# Patient Record
Sex: Female | Born: 1985 | Race: White | Hispanic: No | Marital: Single | Smoking: Former smoker
Health system: Southern US, Community
[De-identification: ages and names within clinical notes are randomized; demographics above are authoritative.]

## PROBLEM LIST (undated history)

## (undated) DIAGNOSIS — T4145XA Adverse effect of unspecified anesthetic, initial encounter: Secondary | ICD-10-CM

## (undated) DIAGNOSIS — I10 Essential (primary) hypertension: Secondary | ICD-10-CM

## (undated) DIAGNOSIS — G43909 Migraine, unspecified, not intractable, without status migrainosus: Secondary | ICD-10-CM

## (undated) DIAGNOSIS — T8859XA Other complications of anesthesia, initial encounter: Secondary | ICD-10-CM

## (undated) DIAGNOSIS — F410 Panic disorder [episodic paroxysmal anxiety] without agoraphobia: Secondary | ICD-10-CM

## (undated) DIAGNOSIS — R002 Palpitations: Secondary | ICD-10-CM

## (undated) HISTORY — DX: Panic disorder (episodic paroxysmal anxiety): F41.0

## (undated) HISTORY — DX: Palpitations: R00.2

## (undated) HISTORY — PX: HAND SURGERY: SHX662

## (undated) HISTORY — PX: WISDOM TOOTH EXTRACTION: SHX21

## (undated) HISTORY — DX: Migraine, unspecified, not intractable, without status migrainosus: G43.909

## (undated) HISTORY — PX: MOUTH SURGERY: SHX715

---
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
## 2006-08-10 ENCOUNTER — Encounter (HOSPITAL_COMMUNITY): Admission: RE | Admit: 2006-08-10 | Discharge: 2006-09-09 | Payer: Self-pay | Admitting: Orthopaedic Surgery

## 2007-01-10 ENCOUNTER — Ambulatory Visit (HOSPITAL_COMMUNITY): Admission: RE | Admit: 2007-01-10 | Discharge: 2007-01-10 | Payer: Self-pay | Admitting: Family Medicine

## 2007-01-13 ENCOUNTER — Ambulatory Visit: Payer: Self-pay | Admitting: Orthopedic Surgery

## 2007-01-21 ENCOUNTER — Ambulatory Visit (HOSPITAL_COMMUNITY): Admission: RE | Admit: 2007-01-21 | Discharge: 2007-01-21 | Payer: Self-pay | Admitting: Orthopedic Surgery

## 2007-01-21 ENCOUNTER — Ambulatory Visit: Payer: Self-pay | Admitting: Orthopedic Surgery

## 2007-01-25 ENCOUNTER — Ambulatory Visit: Payer: Self-pay | Admitting: Orthopedic Surgery

## 2007-02-07 ENCOUNTER — Ambulatory Visit: Payer: Self-pay | Admitting: Orthopedic Surgery

## 2007-02-17 ENCOUNTER — Ambulatory Visit: Payer: Self-pay | Admitting: Orthopedic Surgery

## 2007-03-15 ENCOUNTER — Ambulatory Visit: Payer: Self-pay | Admitting: Orthopedic Surgery

## 2008-01-02 ENCOUNTER — Other Ambulatory Visit: Admission: RE | Admit: 2008-01-02 | Discharge: 2008-01-02 | Payer: Self-pay | Admitting: Obstetrics and Gynecology

## 2008-02-10 ENCOUNTER — Encounter: Payer: Self-pay | Admitting: Orthopedic Surgery

## 2008-03-10 ENCOUNTER — Emergency Department (HOSPITAL_COMMUNITY): Admission: EM | Admit: 2008-03-10 | Discharge: 2008-03-10 | Payer: Self-pay | Admitting: Emergency Medicine

## 2008-03-10 ENCOUNTER — Encounter: Payer: Self-pay | Admitting: Orthopedic Surgery

## 2008-03-14 ENCOUNTER — Ambulatory Visit: Payer: Self-pay | Admitting: Orthopedic Surgery

## 2008-03-14 DIAGNOSIS — S52539A Colles' fracture of unspecified radius, initial encounter for closed fracture: Secondary | ICD-10-CM | POA: Insufficient documentation

## 2008-03-14 DIAGNOSIS — M25519 Pain in unspecified shoulder: Secondary | ICD-10-CM | POA: Insufficient documentation

## 2008-04-23 ENCOUNTER — Ambulatory Visit: Payer: Self-pay | Admitting: Orthopedic Surgery

## 2008-07-09 ENCOUNTER — Ambulatory Visit (HOSPITAL_COMMUNITY): Admission: RE | Admit: 2008-07-09 | Discharge: 2008-07-09 | Payer: Self-pay | Admitting: Family Medicine

## 2008-07-30 ENCOUNTER — Emergency Department (HOSPITAL_COMMUNITY): Admission: EM | Admit: 2008-07-30 | Discharge: 2008-07-31 | Payer: Self-pay | Admitting: Emergency Medicine

## 2009-11-21 IMAGING — CR DG WRIST COMPLETE 3+V*L*
2 series · 2 of 2 positions shown · non-contrast
Comparison: None

CLINICAL DATA: Fall, left wrist pain

LEFT WRIST - COMPLETE 3+ VIEW

[view not recorded (1 of 2)]
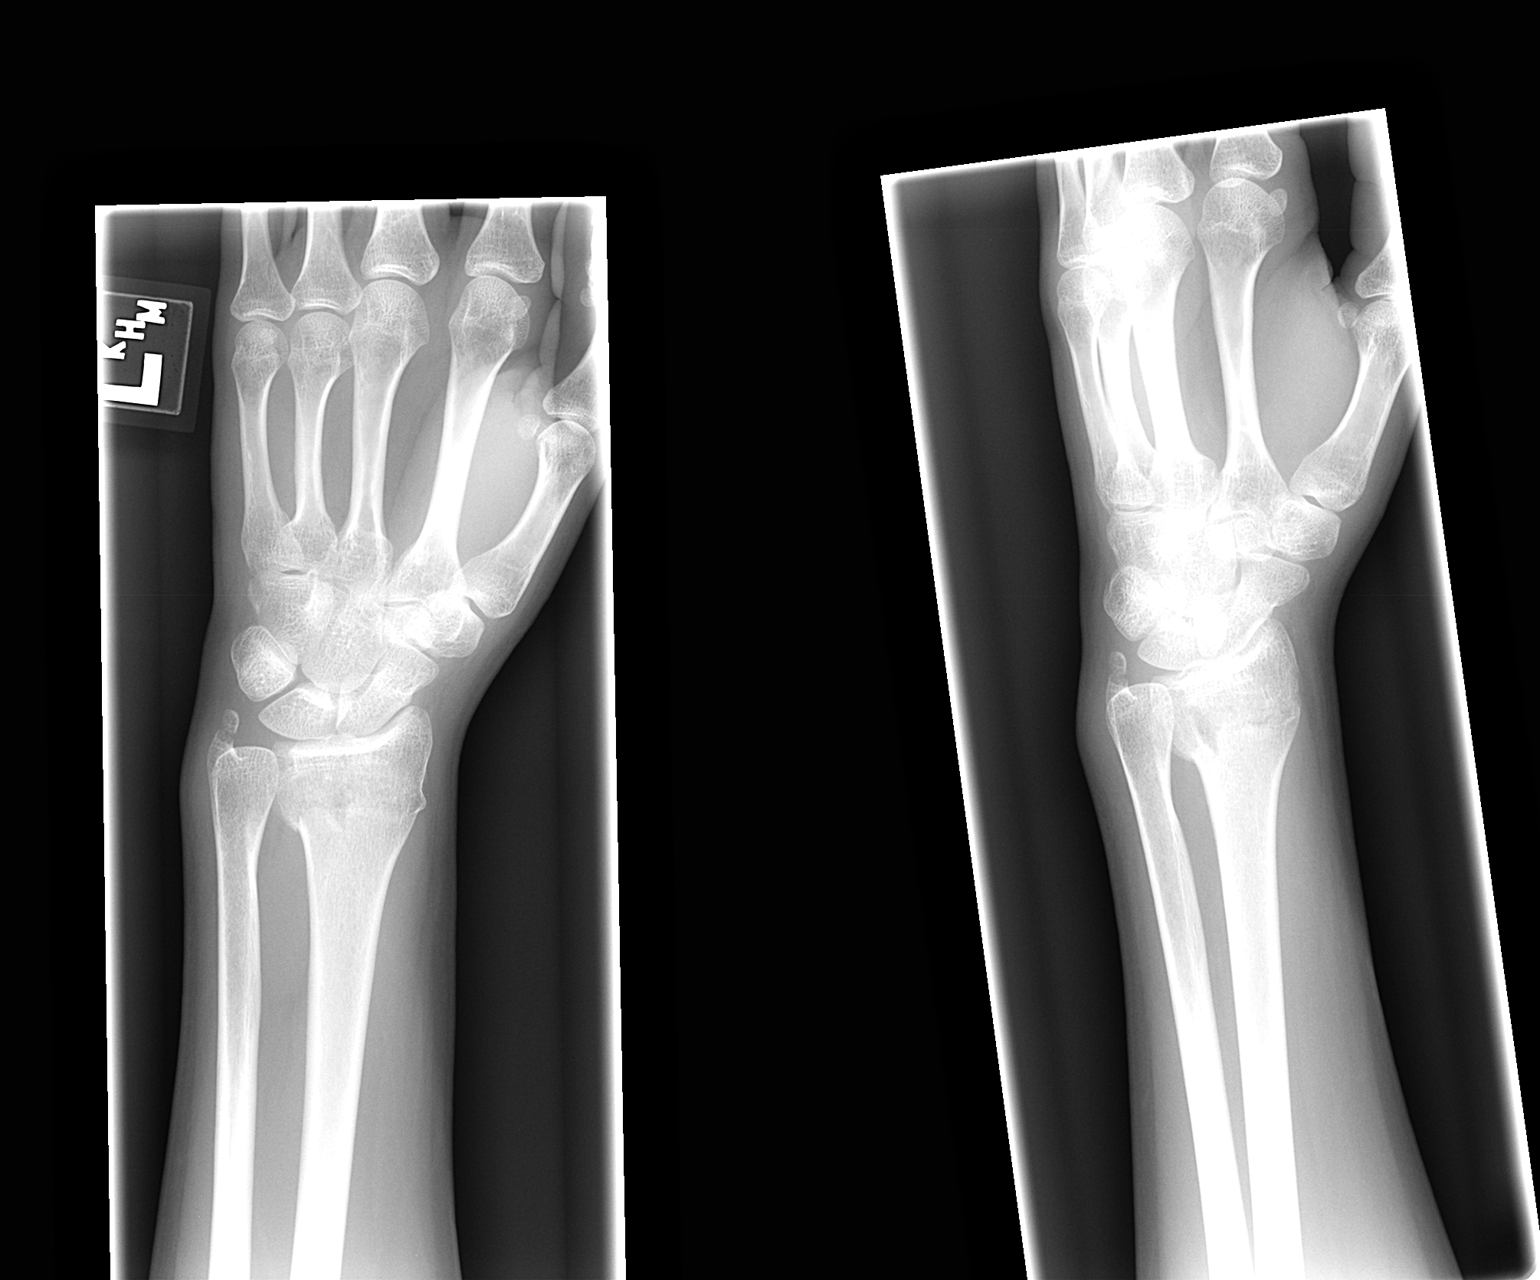

[view not recorded (2 of 2)]
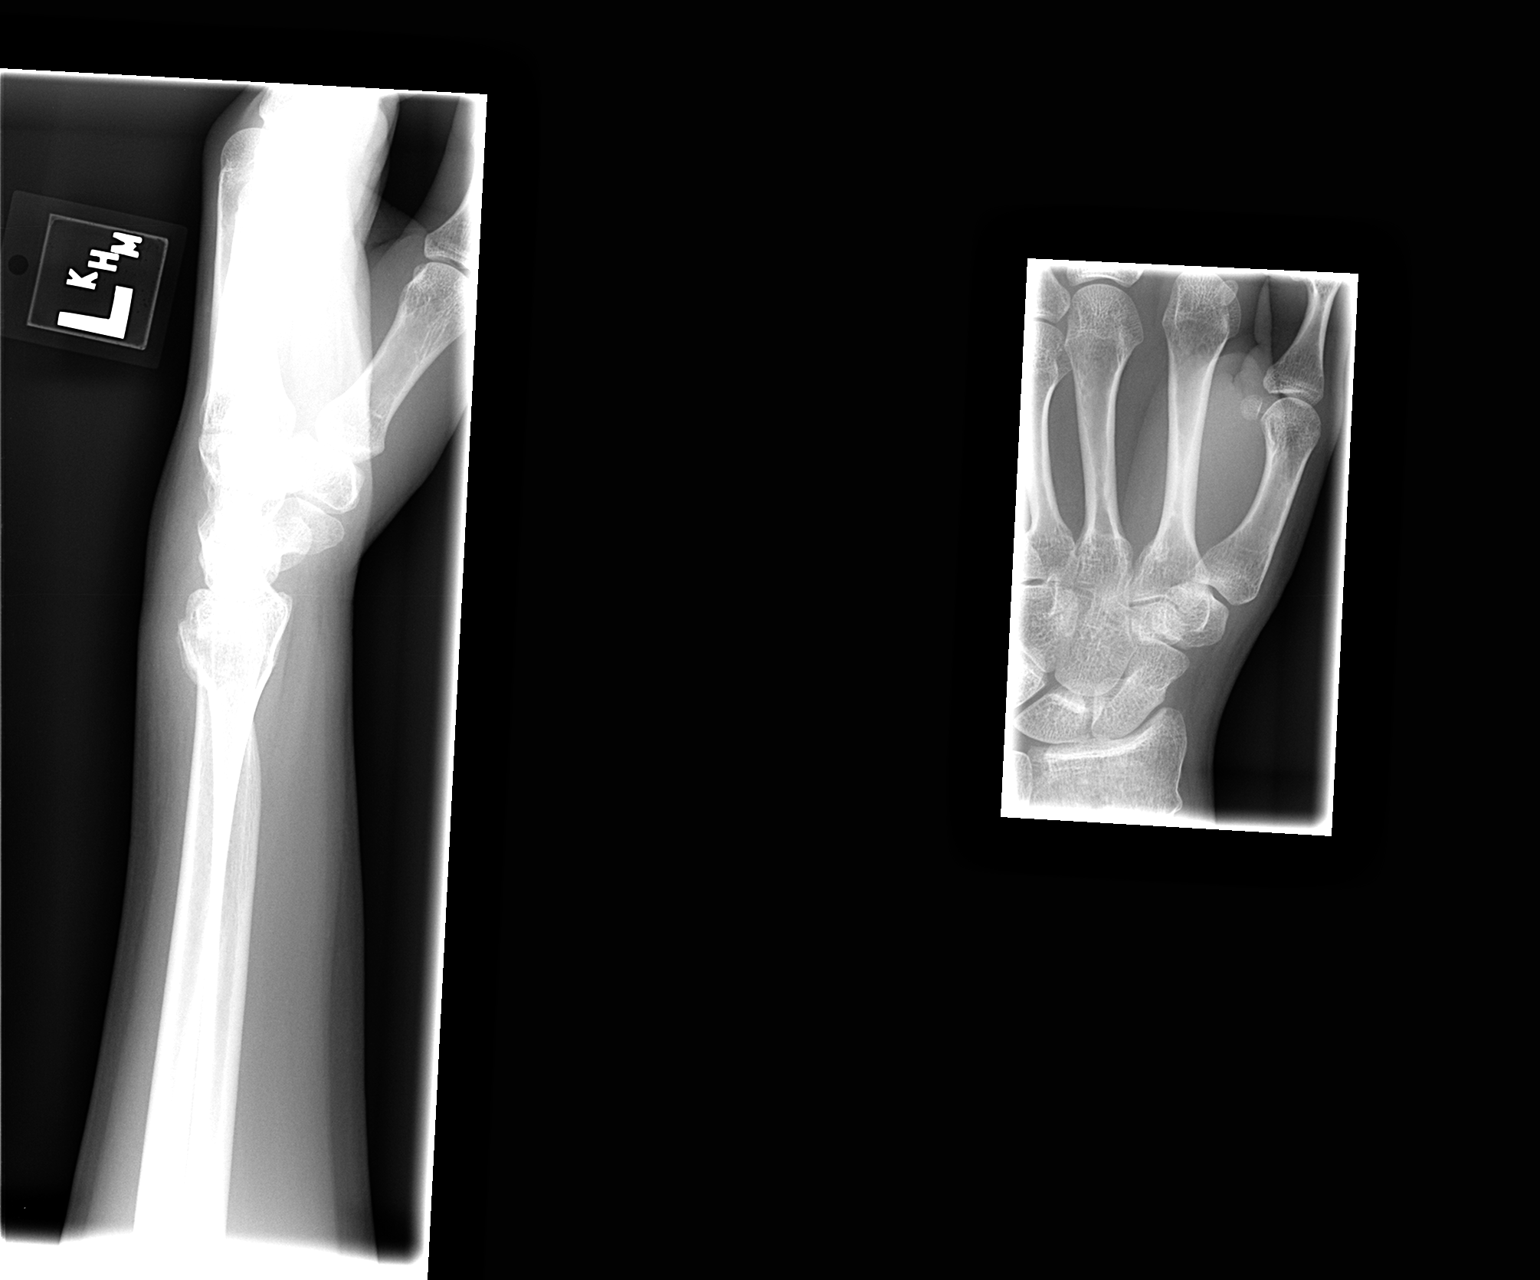

[2 of 2 positions shown; findings below may reference images not displayed]

FINDINGS: Transverse fractures through the distal left radius and
ulnar styloid process are noted, without appreciable fracture
fragment angulation.  Overlying soft tissue swelling is present.
No radiopaque foreign body.
IMPRESSION: Distal left radius and ulnar styloid process fractures.

## 2010-03-22 IMAGING — CT CT HEAD W/O CM
1 series · 16 of 30 positions shown, 20 images · non-contrast
Comparison: None available

CLINICAL DATA: Headaches, dizziness

CT HEAD WITHOUT CONTRAST
TECHNIQUE: Contiguous axial images were obtained from the base of
the skull through the vertex without contrast.

[Series 2: headseq 4.8 h37s · axial · 0.43mm/px · z∈[+96,+256]mm · 16 of 36 slices shown, 20 images]
[im 2/36  brain]
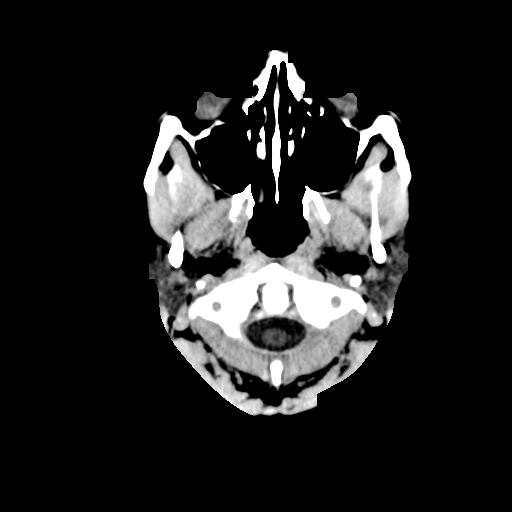
[im 2/36  bone]
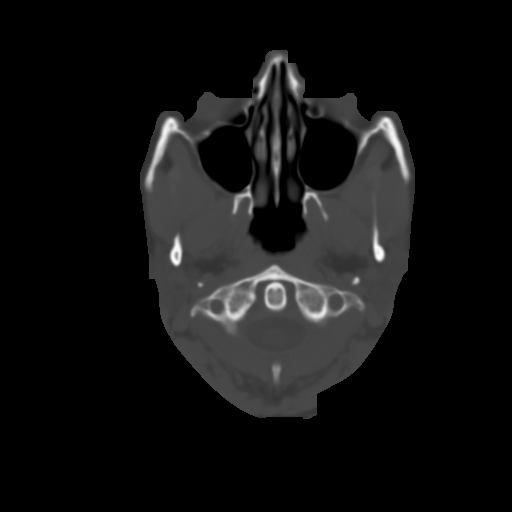
[im 4/36  brain]
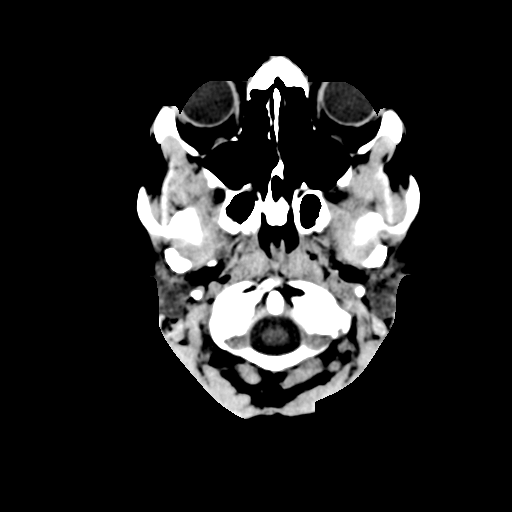
[im 7/36  brain]
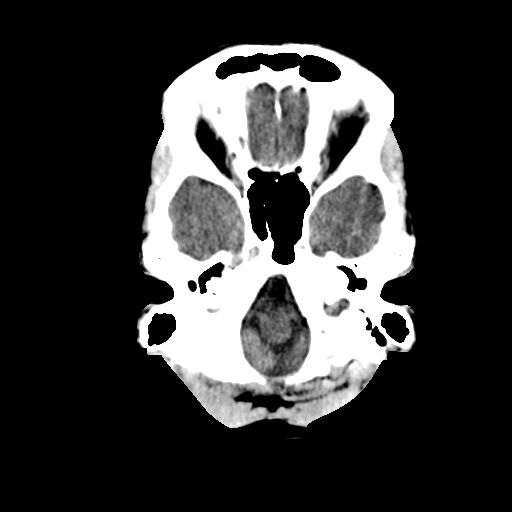
[im 9/36  brain]
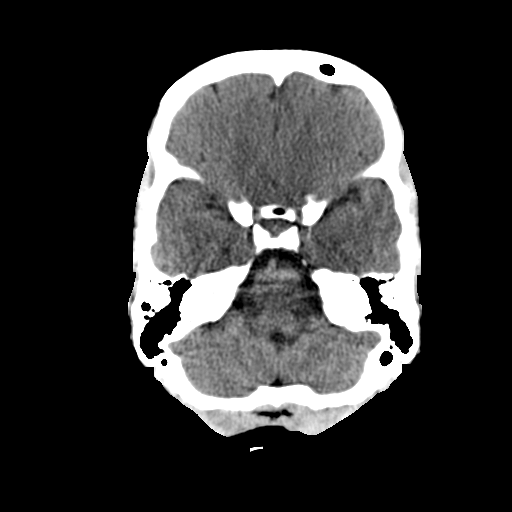
[im 10/36  brain]
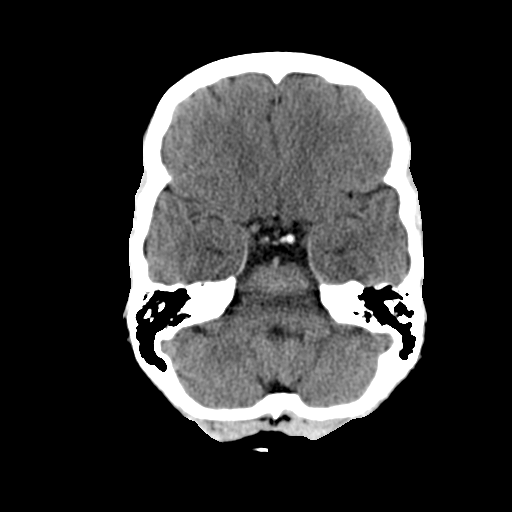
[im 10/36  bone]
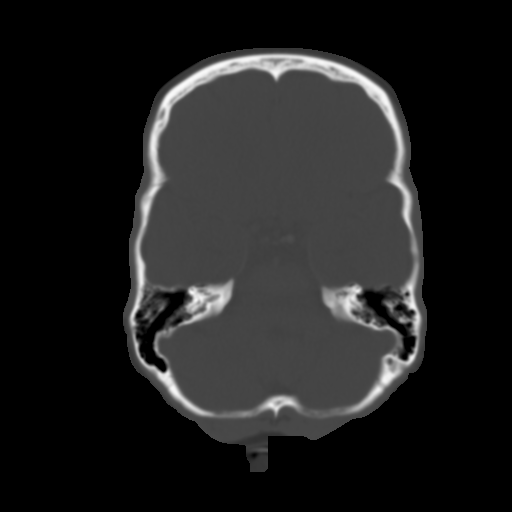
[im 13/36  brain]
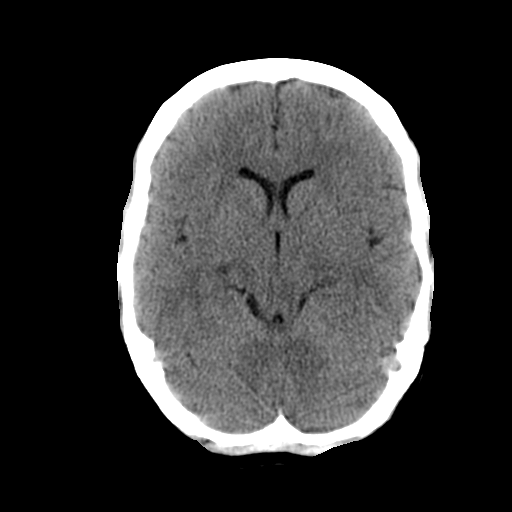
[im 15/36  brain]
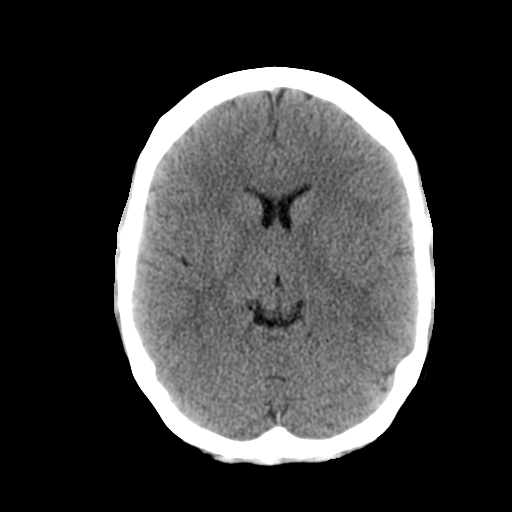
[im 17/36  brain]
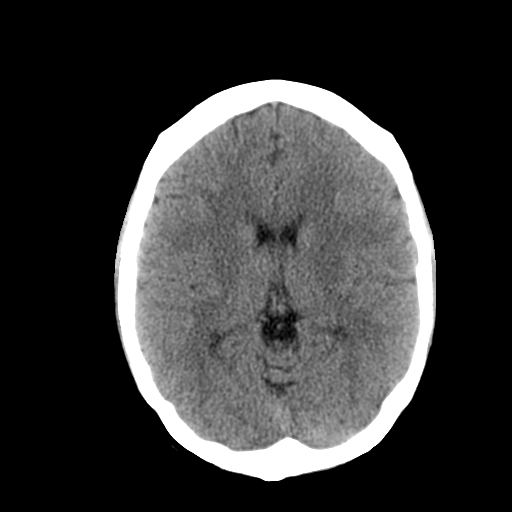
[im 19/36  brain]
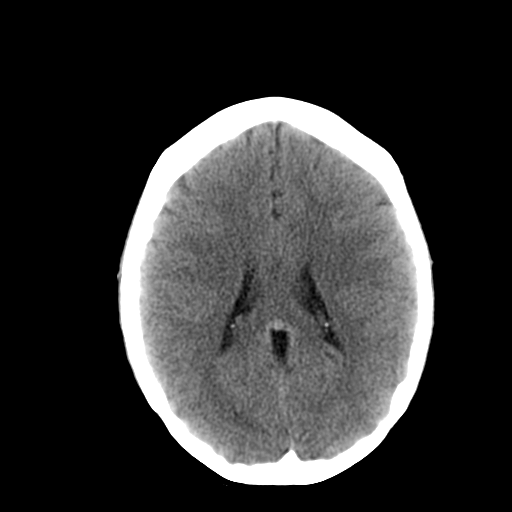
[im 19/36  bone]
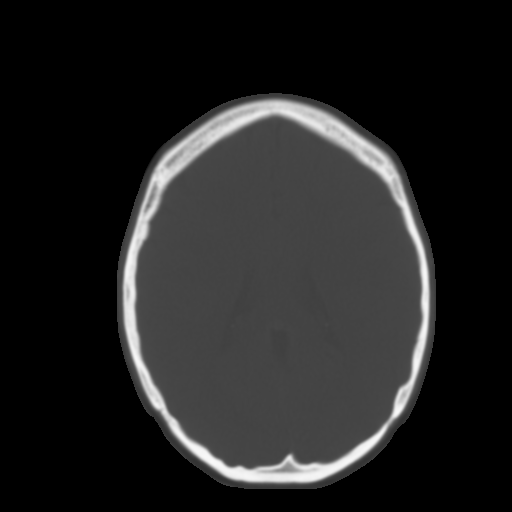
[im 21/36  brain]
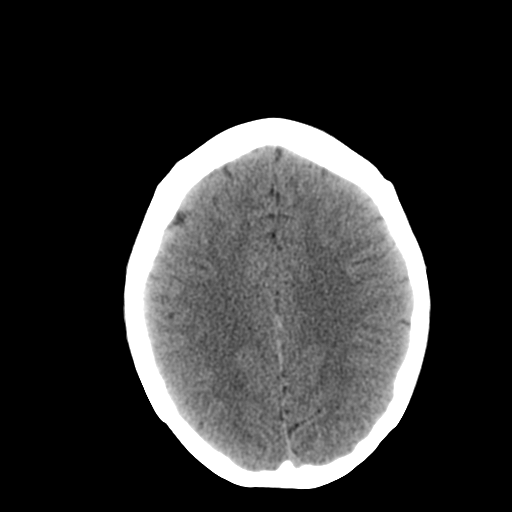
[im 23/36  brain]
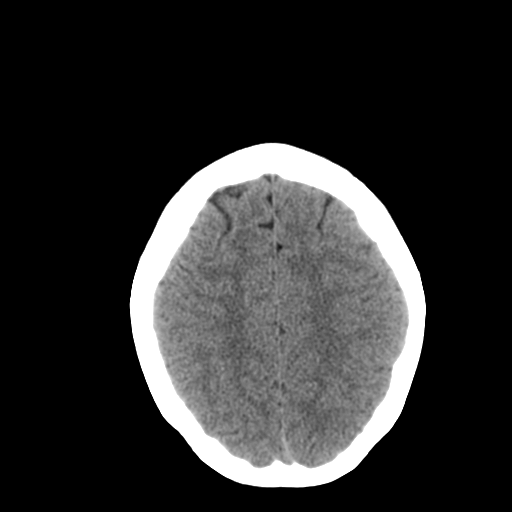
[im 26/36  brain]
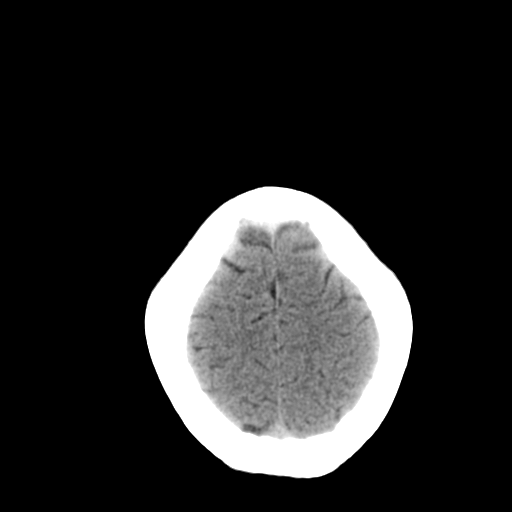
[im 27/36  brain]
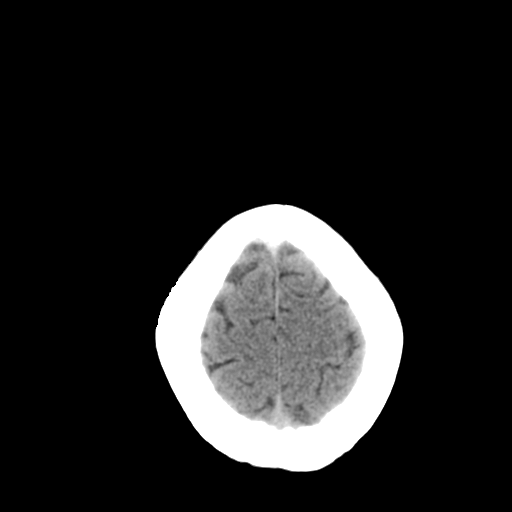
[im 27/36  bone]
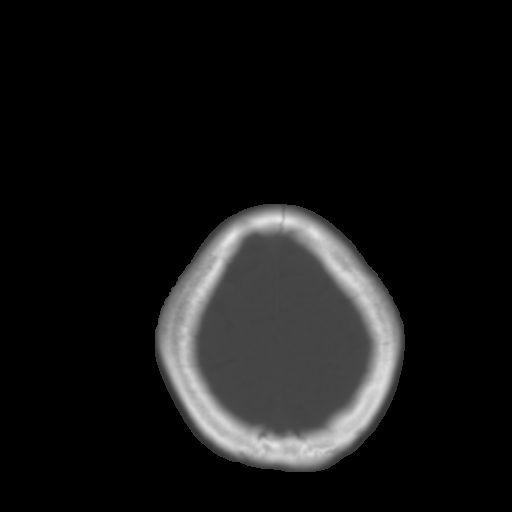
[im 29/36  brain]
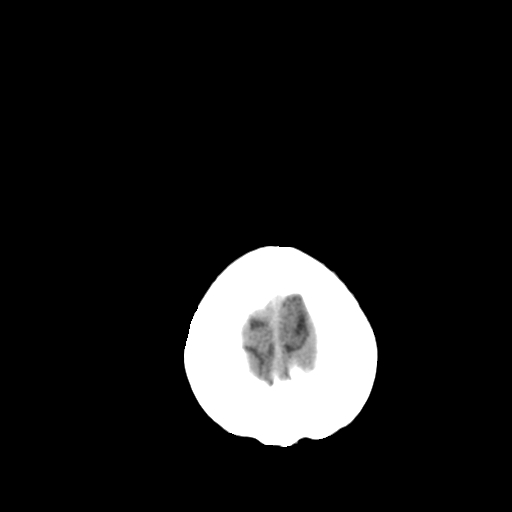
[im 32/36  brain]
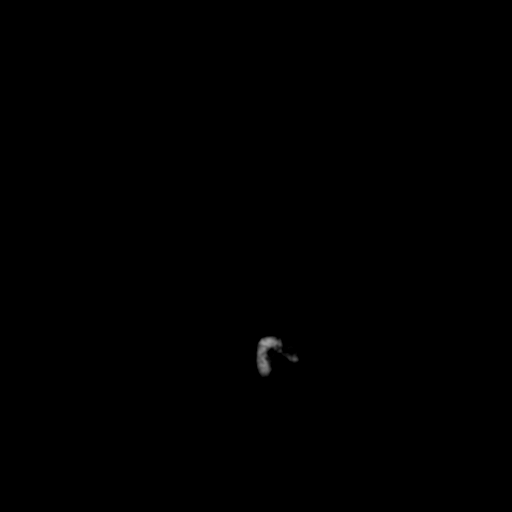
[im 34/36  brain]
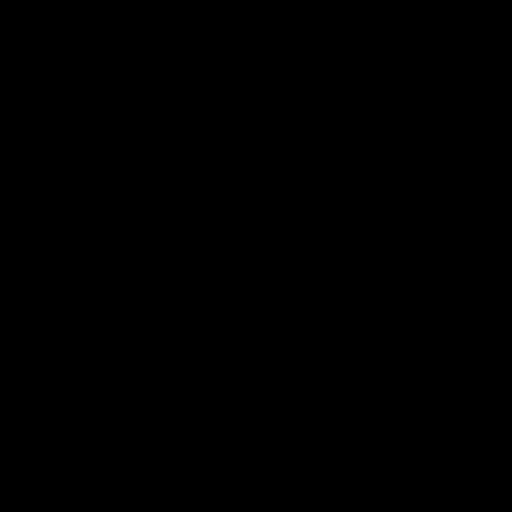

[16 of 30 positions shown; findings below may reference images not displayed]

FINDINGS: There is no evidence of acute intracranial hemorrhage,
brain edema, mass,  mass effect, or midline shift. Acute infarct
may be inapparent on noncontrast CT.  No other intra-axial
abnormalities are seen, and the ventricles and sulci are within
normal limits in size and symmetry.   No abnormal extra-axial fluid
collections or masses are identified.  No significant calvarial
abnormality.
IMPRESSION: 1.  Negative for bleed or other acute intracranial process.

## 2010-04-29 ENCOUNTER — Emergency Department (HOSPITAL_COMMUNITY): Admission: EM | Admit: 2010-04-29 | Discharge: 2010-04-29 | Payer: Self-pay | Admitting: Emergency Medicine

## 2010-05-08 ENCOUNTER — Emergency Department (HOSPITAL_COMMUNITY): Admission: EM | Admit: 2010-05-08 | Discharge: 2010-05-08 | Payer: Self-pay | Admitting: Family Medicine

## 2011-01-25 LAB — URINALYSIS, ROUTINE W REFLEX MICROSCOPIC
Glucose, UA: NEGATIVE mg/dL
Hgb urine dipstick: NEGATIVE
Ketones, ur: >80 mg/dL — AB
Nitrite: NEGATIVE
Protein, ur: NEGATIVE mg/dL
Specific Gravity, Urine: >1.03 — ABNORMAL HIGH (ref 1.005–1.030)
Urobilinogen, UA: 0.2 mg/dL (ref 0.0–1.0)
pH: 5 (ref 5.0–8.0)

## 2011-01-25 LAB — POCT PREGNANCY, URINE: Preg Test, Ur: NEGATIVE

## 2011-01-25 LAB — PREGNANCY, URINE: Preg Test, Ur: NEGATIVE

## 2011-03-27 NOTE — H&P (Signed)
Carrie Giles, Carrie Giles                 ACCOUNT NO.:  [id number]   MEDICAL RECORD NO.:  [id number]          PATIENT TYPE:  AMB   LOCATION:                                FACILITY:  [facility]   PHYSICIAN:  Vickki Hearing, M.D.DATE OF BIRTH:  [birth year]   DATE OF ADMISSION:  DATE OF DISCHARGE:  LH                              HISTORY & PHYSICAL   CHIEF COMPLAINT:  Pain over the right fifth metacarpal.   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  Sustained a fifth  metacarpal fracture.  Presented to her primary care doctor's at [facility] where x-rays were taken and showed a fifth metacarpal  fracture with greater than 25 degrees of angulation and mild rotation.  She was sent to our office for definitive care.   She complains of intermittent right hand pain graded at 5/10,  occasionally 8/10, which is aching, associated with swelling, relieved  with splinting and rest.   REVIEW OF SYSTEMS:  She denies weight loss, chest pain, shortness of  breath, nausea, hematuria, thyroid disease, diabetes, skin cancer,  vertigo, or tinnitus.  She does report headache, joint pain, depression,  mood swings, panic attacks, and seasonal allergies.   She does not have any allergies to medication.   No medical problems.  She had surgery for impacted teeth.   She is currently on Advil and Implanon.   She has a negative family history.   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  Does not smoke and drinks very  little.  She is single.   VITAL SIGNS:  To be recorded in preop.  Weight 110.  APPEARANCE:  Normal, thin, normal development.  HEENT:  Normal.  NECK:  Supple.  RESPIRATORY:  Clear.  CARDIAC:  Regular rate and rhythm.  PERIPHERAL VASCULAR SYSTEM:  No swelling.  Normal pulses and  temperature.  LYMPH NODES:  Benign.  ABDOMEN:  Nondistended.  EXTREMITIES:  Examination reveals tenderness and swelling  over the fifth  metacarpal neck.  There is mild rotation on the passive flexion test  compared to the opposite hand.  There is tenderness over the distal  fifth metacarpal bone, and grip strength has been affected.  Skin has  evidence of subcu bleeding.  Sensation is normal.  NEUROLOGIC:  She is oriented x3.  Mood and affect are normal.   X-rays do show an angulated fifth metacarpal fracture.  Clinically,  there is rotational malalignment.  Recommend closed reduction and  percutaneous pinning of 5 to 4.   DIAGNOSIS:  Closed right fifth metacarpal fracture.  Code 815.03.  CPT  code 78295.  I did give her 60 Lortab 5 mg; take 1 q.4 p.r.n. for pain      Vickki Hearing, M.D.  Electronically Signed     SEH/MEDQ  D:  01/13/2007  T:  01/13/2007  Job:  [id number]   cc:   [facility]  Fax: [phone]   Vickki Hearing, M.D.  Fax: [phone]

## 2011-03-27 NOTE — Op Note (Signed)
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  The  recommendation was made for surgery. Because of other commitments, she  could not do the surgery until today.  Informed consent was obtained.   PREOPERATIVE DIAGNOSIS:  Closed right fifth metacarpal neck fracture.   POSTOPERATIVE DIAGNOSIS:  Closed right fifth metacarpal neck fracture.   PROCEDURE:  Closed reduction, percutaneous pinning, right fifth  metacarpal fracture.   FINDINGS:  Rotationally deformed angulated right fifth metacarpal  fracture.   SURGEON:  Vickki Hearing, M.D.   ASSISTANT:  None.   ANESTHESIA:  Regional Bier block.   SPECIMENS:  None.   ESTIMATED BLOOD LOSS:  None.   COMPLICATIONS:  None.   COUNTS:  Correct.   DISPOSITION:  The patient went to PACU in good condition.   POSTOPERATIVE PLAN:  Ice, elevation, and follow-up in the office.  She  is discharged home with Lortab 5 mg, 1-2 q.4h. for pain.   DETAILS OF THE PROCEDURE:  This patient was first seen in the preop  holding area identified as Clement Sayres.  The proper marking was placed  over the right fifth metacarpal, countersigned by the surgeon, and the  history and physical was updated.  She was taken to the operating room  where a Bier block was established, antibiotics were started, the right  upper extremity was prepped and draped using sterile technique.  The the  time out procedure was completed, limb site confirmed as right fifth  metacarpal.   A closed manipulation was performed under C-arm.  Two pins were placed  from the fifth metacarpal to the fourth metacarpal. Radiographs, AP,  lateral, and oblique, confirmed reduction.  The finger was wrapped up  and splinted 4-5 with the ring finger serving as a splint.  The  tourniquet was released.  Fingers were viable.  The patient was taken to  the recovery room in good condition.  We did give her a supplemental 5  mL block of Marcaine to assist with postoperative pain.      Vickki Hearing, M.D.  Electronically Signed     SEH/MEDQ  D:  01/21/2007  T:  01/22/2007  Job:  [id number]

## 2011-12-22 ENCOUNTER — Other Ambulatory Visit (HOSPITAL_COMMUNITY)
Admission: RE | Admit: 2011-12-22 | Discharge: 2011-12-22 | Disposition: A | Discharge status: Home / Self Care | Payer: Self-pay | Source: Ambulatory Visit | Attending: Obstetrics and Gynecology | Admitting: Obstetrics and Gynecology

## 2011-12-22 DIAGNOSIS — Z01419 Encounter for gynecological examination (general) (routine) without abnormal findings: Secondary | ICD-10-CM | POA: Insufficient documentation

## 2011-12-22 DIAGNOSIS — Z113 Encounter for screening for infections with a predominantly sexual mode of transmission: Secondary | ICD-10-CM | POA: Insufficient documentation

## 2013-05-24 ENCOUNTER — Telehealth: Payer: Self-pay | Admitting: *Deleted

## 2013-05-24 NOTE — Telephone Encounter (Signed)
Spoke with pt. Had sex yesterday and was fine afterwards but about 5-6 hours later she started bleeding and cramping. Supposed to have period in 2 weeks. No new partner. Has no allergies to Ibuprofen so I advised to take Ibuprofen for discomfort. Not pregnant, had a normal period last month. Has an appt scheduled for tomorrow. Pt reassured. JSY

## 2013-05-25 ENCOUNTER — Ambulatory Visit: Payer: Self-pay | Admitting: Obstetrics and Gynecology

## 2013-11-09 DIAGNOSIS — R002 Palpitations: Secondary | ICD-10-CM

## 2013-11-09 HISTORY — DX: Palpitations: R00.2

## 2013-12-15 ENCOUNTER — Ambulatory Visit: Payer: Self-pay | Admitting: Cardiology

## 2013-12-26 ENCOUNTER — Ambulatory Visit: Payer: Self-pay | Admitting: Cardiovascular Disease

## 2014-01-17 ENCOUNTER — Encounter: Payer: Self-pay | Admitting: *Deleted

## 2014-01-17 DIAGNOSIS — F419 Anxiety disorder, unspecified: Secondary | ICD-10-CM | POA: Insufficient documentation

## 2014-01-18 ENCOUNTER — Encounter: Payer: Self-pay | Admitting: Internal Medicine

## 2014-01-18 ENCOUNTER — Ambulatory Visit (INDEPENDENT_AMBULATORY_CARE_PROVIDER_SITE_OTHER): Payer: BC Managed Care – PPO | Admitting: Internal Medicine

## 2014-01-18 ENCOUNTER — Encounter (INDEPENDENT_AMBULATORY_CARE_PROVIDER_SITE_OTHER): Payer: Self-pay

## 2014-01-18 VITALS — BP 119/74 | HR 79 | Ht 66.0 in | Wt 120.0 lb

## 2014-01-18 DIAGNOSIS — R002 Palpitations: Secondary | ICD-10-CM

## 2014-01-18 NOTE — Progress Notes (Signed)
HPI Patient is a 28 yo who is referred for palpitations.   The patient says the spells began about 2 to 4 years ago when she started having panic attacks.  Not sure what triggers.  Spells usually occur at night.  Can happen every night.  Occasionally dizzy  Took BP ecently with spell and was 157/105.  Had some N/V  Pt says she has had N/V with panic spells and has passed out.  She does not remember. Does not notice at other times.  Active  Paints    Notes occasional sharp chest pains ( L sided that are pleuritic)  She was seen by primary MD recently  TSH, CBC and electrolytes were normal Placed on propranolol  She thinks they have helped.    Recent  LDL 92, HDL 55.   Allergies  Allergen Reactions  . Zanaflex [Tizanidine Hcl]     Current Outpatient Prescriptions  Medication Sig Dispense Refill  . busPIRone (BUSPAR) 5 MG tablet Take 5 mg by mouth at bedtime.       . diazepam (VALIUM) 5 MG tablet Take 5 mg by mouth every 6 (six) hours as needed for anxiety.      Marland Kitchen omeprazole (PRILOSEC) 20 MG capsule Take 20 mg by mouth daily.      . propranolol (INDERAL) 20 MG tablet Take 20 mg by mouth 2 (two) times daily.        No current facility-administered medications for this visit.    History reviewed. No pertinent past medical history.  Past Surgical History  Procedure Laterality Date  . Mouth surgery    . Hand surgery      Family History  Problem Relation Age of Onset  . Heart disease Father   . Colitis Brother   . Thyroid disease Sister     History   Social History  . Marital Status: Single    Spouse Name: N/A    Number of Children: N/A  . Years of Education: N/A   Occupational History  . Not on file.   Social History Main Topics  . Smoking status: Former Games developer  . Smokeless tobacco: Not on file  . Alcohol Use: Yes  . Drug Use: No  . Sexual Activity: Not on file   Other Topics Concern  . Not on file   Social History Narrative  . No narrative on file    Review  of Systems:  All systems reviewed.  They are negative to the above problem except as previously stated.  Vital Signs: BP 119/74  Pulse 79  Ht 5\' 6"  (1.676 m)  Wt 120 lb (54.432 kg)  BMI 19.38 kg/m2  Physical Exam Patient is in NAD HEENT:  Normocephalic, atraumatic. EOMI, PERRLA.  Neck: JVP is normal.  No bruits.  Lungs: clear to auscultation. No rales no wheezes.  Heart: Regular rate and rhythm. Normal S1, S2. No S3.   No significant murmurs. PMI not displaced.  Abdomen:  Supple, nontender. Normal bowel sounds. No masses. No hepatomegaly.  Extremities:   Good distal pulses throughout. No lower extremity edema.  Musculoskeletal :moving all extremities.  Neuro:   alert and oriented x3.  CN II-XII grossly intact.  EKG  SR  70 bpm   Assessment and Plan:  1.  Palpitations.  I am not sure what these represent.  Only at night  BP at time is very high.  (if reliable)   I would set up for event monitor. Continue current meds  I also recomm  that she keep a diary of symptoms  Will follow up with patient with test results.    2.  Tob  Patient says she has quit.    3.  Lipids  Excellent

## 2014-01-18 NOTE — Patient Instructions (Addendum)
Your physician wants you to follow-up to be determined after results of 28 day event monitor   Your physician recommends that you continue on your current medications as directed. Please refer to the Current Medication list given to you today.    Your physician has recommended that you wear a 28 day event monitor. Event monitors are medical devices that record the heart's electrical activity. Doctors most often us these monitors to diagnose arrhythmias. Arrhythmias are problems with the speed or rhythm of the heartbeat. The monitor is a small, portable device. You can wear one while you do your normal daily activities. This is usually used to diagnose what is causing palpitations/syncope (passing out).      Thank you for choosing [facility] !

## 2014-01-19 ENCOUNTER — Encounter: Payer: Self-pay | Admitting: Internal Medicine

## 2014-01-23 ENCOUNTER — Encounter: Payer: Self-pay | Admitting: Internal Medicine

## 2014-02-16 ENCOUNTER — Telehealth: Payer: Self-pay | Admitting: *Deleted

## 2014-02-16 NOTE — Telephone Encounter (Signed)
Ecardio called with a serious EKG of SVT 162. Called pt she was sitting at the time and states she feels well.

## 2014-02-19 NOTE — Telephone Encounter (Signed)
Strips sent to [facility] [address] [facility] for Dr Tenny Crawoss review on 02-19-14

## 2014-02-19 NOTE — Telephone Encounter (Signed)
I need to see strips  I am not in [place] for awhile  Send to [address].

## 2014-02-22 NOTE — Telephone Encounter (Signed)
Reviewed.  Strips shows sinus tachycardia  No arrhythmia. Follow.

## 2014-02-23 ENCOUNTER — Telehealth: Payer: Self-pay | Admitting: *Deleted

## 2014-02-23 NOTE — Telephone Encounter (Signed)
[facility]enny Crawoss review.

## 2014-03-05 ENCOUNTER — Telehealth: Payer: Self-pay | Admitting: *Deleted

## 2014-03-05 NOTE — Telephone Encounter (Signed)
Left message for pt to call Monitor reviewed by dr Tenny Crawross shows sinus/sinus tach No arrhythmia. Rec: can cont inderal. Can increase to 20 tid or 40 bid. Stay hydrated. Increase salt intake.

## 2014-03-20 NOTE — Telephone Encounter (Signed)
Left message for pt to call.

## 2014-03-20 NOTE — Telephone Encounter (Signed)
Spoke with pt, Aware of dr ross recommendation  

## 2014-05-02 ENCOUNTER — Telehealth: Payer: Self-pay | Admitting: Internal Medicine

## 2014-05-02 MED ORDER — PROPRANOLOL HCL 20 MG PO TABS: 20.0000 mg | ORAL_TABLET | Freq: Two times a day (BID) | ORAL | Status: DC

## 2014-05-02 NOTE — Telephone Encounter (Signed)
Medication sent via escribe.  

## 2014-05-02 NOTE — Telephone Encounter (Signed)
Patient states that she needs Propan[facility] refill [facility]

## 2014-06-07 ENCOUNTER — Ambulatory Visit (INDEPENDENT_AMBULATORY_CARE_PROVIDER_SITE_OTHER): Payer: BC Managed Care – PPO | Admitting: Advanced Practice Midwife

## 2014-06-07 ENCOUNTER — Encounter: Payer: Self-pay | Admitting: Advanced Practice Midwife

## 2014-06-07 ENCOUNTER — Other Ambulatory Visit (INDEPENDENT_AMBULATORY_CARE_PROVIDER_SITE_OTHER): Payer: BC Managed Care – PPO | Admitting: Advanced Practice Midwife

## 2014-06-07 VITALS — BP 106/60 | Ht 66.0 in | Wt 124.0 lb

## 2014-06-07 DIAGNOSIS — R002 Palpitations: Secondary | ICD-10-CM | POA: Insufficient documentation

## 2014-06-07 DIAGNOSIS — Z3009 Encounter for other general counseling and advice on contraception: Secondary | ICD-10-CM

## 2014-06-07 NOTE — Progress Notes (Signed)
[facility] Visit  Patient name: Waldemar Dickensrika L Dake MRN [id number]  Date of birth: Jun 25, 1986  CC & HPI:  Waldemar Dickensrika L Brousseau is a 28 y.o. Caucasian female presenting today to discuss getting Nexplanon. She had nexplanon a while back and did great.  No kids, boyfriend had vascetomy which reanastomized. On period now.   Pertinent History Reviewed:  Medical & Surgical Hx:   Past Medical History  Diagnosis Date  . Heart palpitations 2015    sinus tach-takes propanolol  . Migraines   . Panic attacks    Past Surgical History  Procedure Laterality Date  . Mouth surgery    . Hand surgery     Medications: Reviewed & Updated - see associated section Social History: Reviewed -  reports that she has quit smoking. She has never used smokeless tobacco.  Objective Findings:  Vitals: BP 106/60  Ht 5\' 6"  (1.676 m)  Wt 124 lb (56.246 kg)  BMI 20.02 kg/m2  LMP 06/06/2014  Physical Examination: General appearance - alert, well appearing, and in no distress Mental status - alert, oriented to person, place, and time Extremities - no pedal edema noted Skin - normal coloration and turgor, no rashes, no suspicious skin lesions noted  Reviewed risks/benefits/SE .  No results found for this or any previous visit (from the past 24 hour(s)).   Assessment & Plan:  A:   nexplanon consult P:  Order nexplanon   F/U 4 weeks for insertion (should be on menses)   CRESENZO-DISHMAN,Alekzander Cardell CNM 06/07/2014 2:30 PM

## 2014-06-14 ENCOUNTER — Ambulatory Visit (INDEPENDENT_AMBULATORY_CARE_PROVIDER_SITE_OTHER): Payer: BC Managed Care – PPO | Admitting: Advanced Practice Midwife

## 2014-06-14 ENCOUNTER — Other Ambulatory Visit (HOSPITAL_COMMUNITY)
Admission: RE | Admit: 2014-06-14 | Discharge: 2014-06-14 | Disposition: A | Discharge status: Home / Self Care | Payer: BC Managed Care – PPO | Source: Ambulatory Visit | Attending: Advanced Practice Midwife | Admitting: Advanced Practice Midwife

## 2014-06-14 ENCOUNTER — Encounter: Payer: Self-pay | Admitting: Advanced Practice Midwife

## 2014-06-14 VITALS — BP 100/70 | Ht 66.0 in | Wt 127.0 lb

## 2014-06-14 DIAGNOSIS — Z01419 Encounter for gynecological examination (general) (routine) without abnormal findings: Secondary | ICD-10-CM | POA: Insufficient documentation

## 2014-06-14 DIAGNOSIS — Z113 Encounter for screening for infections with a predominantly sexual mode of transmission: Secondary | ICD-10-CM | POA: Insufficient documentation

## 2014-06-14 NOTE — Progress Notes (Signed)
Carrie DickensErika L Giles 27 y.o.  Filed Vitals:   06/14/14 1538  BP: 100/70     Past Medical History: Past Medical History  Diagnosis Date  . Heart palpitations 2015    sinus tach-takes propanolol  . Migraines   . Panic attacks     Past Surgical History: Past Surgical History  Procedure Laterality Date  . Mouth surgery    . Hand surgery    . Wisdom tooth extraction      Family History: Family History  Problem Relation Age of Onset  . Heart disease Father   . Colitis Brother   . Thyroid disease Sister   . Colitis Brother     Social History: History  Substance Use Topics  . Smoking status: Former Smoker    Types: Cigarettes  . Smokeless tobacco: Never Used  . Alcohol Use: No    Allergies:  Allergies  Allergen Reactions  . Zanaflex [Tizanidine Hcl] Other (See Comments)    Panic attack     Current outpatient prescriptions:ACZONE 5 % topical gel, Apply topically 2 (two) times daily. , Disp: , Rfl: ;  busPIRone (BUSPAR) 5 MG tablet, Take 5 mg by mouth at bedtime. , Disp: , Rfl: ;  diazepam (VALIUM) 5 MG tablet, Take 5 mg by mouth every 6 (six) hours as needed for anxiety., Disp: , Rfl: ;  HYDROcodone-acetaminophen (NORCO) 10-325 MG per tablet, , Disp: , Rfl:  omeprazole (PRILOSEC) 20 MG capsule, Take 20 mg by mouth daily., Disp: , Rfl: ;  penicillin v potassium (VEETID) 500 MG tablet, , Disp: , Rfl: ;  promethazine (PHENERGAN) 25 MG tablet, , Disp: , Rfl: ;  propranolol (INDERAL) 20 MG tablet, Take 1 tablet (20 mg total) by mouth 2 (two) times daily., Disp: 60 tablet, Rfl: 6;  tretinoin (RETIN-A) 0.025 % cream, Apply topically 2 (two) times daily. , Disp: , Rfl:   History of Present Illness: Here for annual exam with pap.  Has had an abnormal pap with negative colpo "a long time ago".  Planning on Nexplanon at end of month when she should be on her period. Using RetinA since November for acne.  States is not an accutane candidate d/t fam hx of ulcerative colitis. Had 4 wisdom  teeth removed a few days ago.     Review of Systems   Patient denies any headaches, blurred vision, shortness of breath, chest pain, abdominal pain, problems with bowel movements, urination, or intercourse.   Physical Exam: General:  Well developed, well nourished, no acute distress Skin:  Warm and dry.  Cystic acne on cheeks Neck:  Midline trachea, normal thyroid Lungs; Clear to auscultation bilaterally Breast:  No dominant palpable mass, retraction, or nipple discharge Cardiovascular: Regular rate and rhythm Abdomen:  Soft, non tender, no hepatosplenomegaly Pelvic:  External genitalia is normal in appearance.  The vagina is normal in appearance.  The cervix is bulbous.  Uterus is felt to be normal size, shape, and contour.  No adnexal masses or tenderness noted.  Extremities:  No swelling or varicosities noted Psych:  No mood changes.     Impression:  Normal GYN exam  Plan:  If normal, Pap q 3 years F/U as scheduled for Nexplanon

## 2014-06-18 LAB — CYTOLOGY - PAP

## 2014-07-05 ENCOUNTER — Ambulatory Visit (INDEPENDENT_AMBULATORY_CARE_PROVIDER_SITE_OTHER): Payer: BC Managed Care – PPO | Admitting: Advanced Practice Midwife

## 2014-07-05 ENCOUNTER — Encounter: Payer: Self-pay | Admitting: Advanced Practice Midwife

## 2014-07-05 VITALS — BP 100/60 | Ht 67.0 in | Wt 122.5 lb

## 2014-07-05 DIAGNOSIS — Z3200 Encounter for pregnancy test, result unknown: Secondary | ICD-10-CM

## 2014-07-05 DIAGNOSIS — Z3202 Encounter for pregnancy test, result negative: Secondary | ICD-10-CM

## 2014-07-05 DIAGNOSIS — Z30017 Encounter for initial prescription of implantable subdermal contraceptive: Secondary | ICD-10-CM

## 2014-07-05 LAB — POCT URINE PREGNANCY: Preg Test, Ur: NEGATIVE

## 2014-07-05 NOTE — Progress Notes (Signed)
  HPI:  Carrie Giles is a 28 y.o. year old Caucasian female here for Nexplanon insertion.  Her LMP was 07/04/14 , and her pregnancy test today was negative.  Risks/benefits/side effects of Nexplanon have been discussed and her questions have been answered.  Specifically, a failure rate of 11/998 has been reported, with an increased failure rate if pt takes St. John's Wort and/or antiseizure medicaitons.  Carrie Giles is aware of the common side effect of irregular bleeding, which the incidence of decreases over time.   Past Medical History: Past Medical History  Diagnosis Date  . Heart palpitations 2015    sinus tach-takes propanolol  . Migraines   . Panic attacks     Past Surgical History: Past Surgical History  Procedure Laterality Date  . Mouth surgery    . Hand surgery    . Wisdom tooth extraction      Family History: Family History  Problem Relation Age of Onset  . Heart disease Father   . Colitis Brother   . Thyroid disease Sister   . Colitis Brother     Social History: History  Substance Use Topics  . Smoking status: Former Smoker    Types: Cigarettes  . Smokeless tobacco: Never Used  . Alcohol Use: No    Allergies:  Allergies  Allergen Reactions  . Zanaflex [Tizanidine Hcl] Other (See Comments)    Panic attack      Her left arm, approximatly 4 inches proximal from the elbow, was cleansed with alcohol and anesthetized with 2cc of 2% Lidocaine.  The area was cleansed again and the Nexplanon was inserted without difficulty.  A pressure bandage was applied.  Pt was instructed to remove pressure bandage in a few hours, and keep insertion site covered with a bandaid for 3 days Follow-up scheduled PRN problems  CRESENZO-DISHMAN,Kingjames Coury 07/05/2014 3:26 PM

## 2015-02-20 ENCOUNTER — Encounter (INDEPENDENT_AMBULATORY_CARE_PROVIDER_SITE_OTHER): Payer: Self-pay | Admitting: *Deleted

## 2015-03-13 ENCOUNTER — Ambulatory Visit (INDEPENDENT_AMBULATORY_CARE_PROVIDER_SITE_OTHER): Payer: Self-pay | Admitting: Internal Medicine

## 2015-03-25 ENCOUNTER — Ambulatory Visit (INDEPENDENT_AMBULATORY_CARE_PROVIDER_SITE_OTHER): Payer: 59 | Admitting: Women's Health

## 2015-03-25 ENCOUNTER — Encounter: Payer: Self-pay | Admitting: Women's Health

## 2015-03-25 VITALS — BP 110/62 | HR 80 | Wt 110.0 lb

## 2015-03-25 DIAGNOSIS — Z3049 Encounter for surveillance of other contraceptives: Secondary | ICD-10-CM

## 2015-03-25 DIAGNOSIS — Z3046 Encounter for surveillance of implantable subdermal contraceptive: Secondary | ICD-10-CM

## 2015-03-25 DIAGNOSIS — E0590 Thyrotoxicosis, unspecified without thyrotoxic crisis or storm: Secondary | ICD-10-CM | POA: Insufficient documentation

## 2015-03-25 NOTE — Progress Notes (Signed)
Patient ID: Carrie Dickensrika L Burgo, female   DOB: Dec 31, 1985, 29 y.o.   MRN: [id number] Carrie Giles is a 29 y.o. year old Caucasian female here for Nexplanon removal.  It was placed Aug 2015. She reports 25lb weight loss, hair loss, n/v. Has seen her PCP, dx w/ hyperthyroidism- not currently on meds. Is seeing GI at beginning of June. Discussed that all sx sound like they are r/t hyperthyroidism- pt adamant she wants nexplanon out. Undecided about what contraception she wants to switch to, would like to give her body a break from hormones to see if helps relieve her sx. Does not want IUD or nuvaring. Thinking about coc's or depo. Patient given informed consent for removal of her Nexplanon.  BP 110/62 mmHg  Pulse 80  Wt 110 lb (49.896 kg)  Appropriate time out taken. Nexplanon site identified.  Area prepped in usual sterile fashon. One cc of 2% lidocaine was used to anesthetize the area at the distal end of the implant. A small stab incision was made right beside the implant on the distal portion.  The Nexplanon rod was grasped using hemostats and removed without difficulty.  There was less than 3 cc blood loss. There were no complications.  Steri-strips were applied over the small incision and a pressure bandage was applied.  The patient tolerated the procedure well.  She was instructed to keep the area clean and dry, remove pressure bandage in 24 hours, and keep insertion site covered with the steri-strip for 3-5 days.    Follow-up PRN problems. Call when she decides what she wants to do for contraception Physical in Aug (neg pap Aug 2015)  Carrie Giles, Carrie Giles CNM, [facility] 03/25/2015 3:37 PM

## 2015-04-10 ENCOUNTER — Other Ambulatory Visit (INDEPENDENT_AMBULATORY_CARE_PROVIDER_SITE_OTHER): Payer: Self-pay | Admitting: *Deleted

## 2015-04-10 ENCOUNTER — Encounter (INDEPENDENT_AMBULATORY_CARE_PROVIDER_SITE_OTHER): Payer: Self-pay | Admitting: Internal Medicine

## 2015-04-10 ENCOUNTER — Encounter (INDEPENDENT_AMBULATORY_CARE_PROVIDER_SITE_OTHER): Payer: Self-pay | Admitting: *Deleted

## 2015-04-10 ENCOUNTER — Ambulatory Visit (INDEPENDENT_AMBULATORY_CARE_PROVIDER_SITE_OTHER): Payer: 59 | Admitting: Internal Medicine

## 2015-04-10 VITALS — BP 100/64 | HR 72 | Temp 98.2°F | Ht 66.0 in | Wt 108.0 lb

## 2015-04-10 DIAGNOSIS — K219 Gastro-esophageal reflux disease without esophagitis: Secondary | ICD-10-CM

## 2015-04-10 DIAGNOSIS — K921 Melena: Secondary | ICD-10-CM

## 2015-04-10 NOTE — Patient Instructions (Signed)
EGD. The risks and benefits such as perforation, bleeding, and infection were reviewed with the patient and is agreeable. 

## 2015-04-10 NOTE — Progress Notes (Signed)
   Subjective:    Patient ID: Carrie DickensErika L Perfecto, female    DOB: [birth year], 29 y.o.   MRN: [id number]  HPI Referred to [facility] by [facility] for nausea and vomiting, hematemesis and weight loss. In 2015, her weight was 124. Today her weight is 108. She has a 16 pound weight loss.  She tells she started vomiting blood about 3 months ago. It is not every time she throws up. She describes as a small amt. Has not vomited any blood in over a week.  She usually vomits blood after she has a nose bleed most of the time.  She tells me her hair is falling. She is presently being worked up for hyperthyroidism.   She usually has a BM daily. No melena or BRRB.  She says her appetite is not good.  She says she is not hungry. She feels nauseated frequently. She was taking Omeprazole but stopped after her hair started falling out.  She says when she vomits, most of the time it is bile (acid) and it burns.  Symptoms usually start around 4am and she becomes nauseated. She has epigastric burning daily.  Family hx of UC in 2 brothers.  LMP yr ago. [facility]   03/25/2015 wt 110 06/07/2014 wt 124 01/18/2014 wt 120  01/24/2015 T4 12.5 T3 127 CBC WBC 8.1, H and H 14.4 and 43.7, Platelet ct 336, BUN 11, Creatinine 0.72,  Total bili 1.3, ALP 66, AST 11, ALT 11,, TSH 0.316, Amylase 53, Lipase 19, H. Pylori negative.  Review of Systems Single, No children. Presently a Consulting civil engineerstudent.  Past Medical History  Diagnosis Date  . Heart palpitations 2015    sinus tach-takes propanolol  . Migraines   . Panic attacks     Past Surgical History  Procedure Laterality Date  . Mouth surgery    . Hand surgery    . Wisdom tooth extraction      Allergies  Allergen Reactions  . Zanaflex [Tizanidine Hcl] Other (See Comments)    Panic attack    Current Outpatient Prescriptions on File Prior to Visit  Medication Sig Dispense Refill  . diazepam (VALIUM) 5 MG tablet Take 5 mg by mouth every 6  (six) hours as needed for anxiety.     No current facility-administered medications on file prior to visit.         Objective:   Physical Exam Blood pressure 100/64, pulse 72, temperature 98.2 F (36.8 C), height 5\' 6"  (1.676 m), weight 108 lb (48.988 kg). Alert and oriented. Skin warm and dry. Oral mucosa is moist.   . Sclera anicteric, conjunctivae is pink. Thyroid not enlarged. No cervical lymphadenopathy. Lungs clear. Heart regular rate and rhythm.  Abdomen is soft. Bowel sounds are positive. No hepatomegaly. No abdominal masses felt. No tenderness.  No edema to lower extremities.          Assessment & Plan:  ? GERD, uncontrolled.  Hx of vomiting small amt of blood ? From nose bleed.  PUD needs to be ruled out.  EGD. The risks and benefits such as perforation, bleeding, and infection were reviewed with the patient and is agreeable. Samples of Nexium given to patient.

## 2015-04-12 ENCOUNTER — Encounter (INDEPENDENT_AMBULATORY_CARE_PROVIDER_SITE_OTHER): Payer: Self-pay

## 2015-04-26 ENCOUNTER — Encounter (HOSPITAL_COMMUNITY): Payer: Self-pay | Admitting: *Deleted

## 2015-04-26 ENCOUNTER — Encounter (HOSPITAL_COMMUNITY)
Admission: RE | Disposition: A | Discharge status: Home / Self Care | Payer: Self-pay | Source: Ambulatory Visit | Attending: Internal Medicine

## 2015-04-26 ENCOUNTER — Ambulatory Visit (HOSPITAL_COMMUNITY)
Admission: RE | Admit: 2015-04-26 | Discharge: 2015-04-26 | Disposition: A | Discharge status: Home / Self Care | Payer: 59 | Source: Ambulatory Visit | Attending: Internal Medicine | Admitting: Internal Medicine

## 2015-04-26 DIAGNOSIS — K449 Diaphragmatic hernia without obstruction or gangrene: Secondary | ICD-10-CM | POA: Insufficient documentation

## 2015-04-26 DIAGNOSIS — R634 Abnormal weight loss: Secondary | ICD-10-CM | POA: Diagnosis present

## 2015-04-26 DIAGNOSIS — R112 Nausea with vomiting, unspecified: Secondary | ICD-10-CM | POA: Diagnosis not present

## 2015-04-26 DIAGNOSIS — Z87891 Personal history of nicotine dependence: Secondary | ICD-10-CM | POA: Insufficient documentation

## 2015-04-26 DIAGNOSIS — F410 Panic disorder [episodic paroxysmal anxiety] without agoraphobia: Secondary | ICD-10-CM | POA: Insufficient documentation

## 2015-04-26 DIAGNOSIS — I10 Essential (primary) hypertension: Secondary | ICD-10-CM | POA: Diagnosis not present

## 2015-04-26 DIAGNOSIS — K210 Gastro-esophageal reflux disease with esophagitis: Secondary | ICD-10-CM | POA: Diagnosis not present

## 2015-04-26 DIAGNOSIS — Z79899 Other long term (current) drug therapy: Secondary | ICD-10-CM | POA: Diagnosis not present

## 2015-04-26 DIAGNOSIS — K219 Gastro-esophageal reflux disease without esophagitis: Secondary | ICD-10-CM

## 2015-04-26 DIAGNOSIS — K208 Other esophagitis: Secondary | ICD-10-CM | POA: Diagnosis not present

## 2015-04-26 DIAGNOSIS — G43909 Migraine, unspecified, not intractable, without status migrainosus: Secondary | ICD-10-CM | POA: Diagnosis not present

## 2015-04-26 DIAGNOSIS — R12 Heartburn: Secondary | ICD-10-CM | POA: Diagnosis present

## 2015-04-26 HISTORY — PX: ESOPHAGOGASTRODUODENOSCOPY: SHX5428

## 2015-04-26 HISTORY — DX: Essential (primary) hypertension: I10

## 2015-04-26 HISTORY — DX: Adverse effect of unspecified anesthetic, initial encounter: T41.45XA

## 2015-04-26 HISTORY — DX: Other complications of anesthesia, initial encounter: T88.59XA

## 2015-04-26 SURGERY — EGD (ESOPHAGOGASTRODUODENOSCOPY)
Anesthesia: Moderate Sedation

## 2015-04-26 MED ORDER — MIDAZOLAM HCL 5 MG/5ML IJ SOLN
INTRAMUSCULAR | Status: AC
  Filled 2015-04-26: qty 10

## 2015-04-26 MED ORDER — MIDAZOLAM HCL 5 MG/5ML IJ SOLN
INTRAMUSCULAR | Status: DC | PRN
Start: 2015-04-26 — End: 2015-04-26
  Administered 2015-04-26: 2 mg via INTRAVENOUS
  Administered 2015-04-26: 3 mg via INTRAVENOUS
  Administered 2015-04-26 (×4): 2 mg via INTRAVENOUS

## 2015-04-26 MED ORDER — BUTAMBEN-TETRACAINE-BENZOCAINE 2-2-14 % EX AERO
INHALATION_SPRAY | CUTANEOUS | Status: DC | PRN
  Administered 2015-04-26: 2 via TOPICAL

## 2015-04-26 MED ORDER — SODIUM CHLORIDE 0.9 % IV SOLN
INTRAVENOUS | Status: DC
  Administered 2015-04-26: 13:00:00 via INTRAVENOUS

## 2015-04-26 MED ORDER — STERILE WATER FOR IRRIGATION IR SOLN
Status: DC | PRN
  Administered 2015-04-26: 14:00:00

## 2015-04-26 MED ORDER — MIDAZOLAM HCL 5 MG/5ML IJ SOLN
INTRAMUSCULAR | Status: DC
Start: 2015-04-26 — End: 2015-04-26
  Filled 2015-04-26: qty 5

## 2015-04-26 MED ORDER — MEPERIDINE HCL 50 MG/ML IJ SOLN
INTRAMUSCULAR | Status: AC
  Filled 2015-04-26: qty 1

## 2015-04-26 MED ORDER — MEPERIDINE HCL 50 MG/ML IJ SOLN
INTRAMUSCULAR | Status: DC | PRN
  Administered 2015-04-26 (×3): 25 mg via INTRAVENOUS

## 2015-04-26 NOTE — H&P (Signed)
Carrie Giles is an 29 y.o. female.   Chief Complaint: Patient is here for EGD. HPI: This 29 year old Caucasian female was at problems with heartburn and sporadic nausea and vomiting. She had Nexplanon placed for contraception in August 2015. Soon thereafter her nausea vomiting and heartburn got worse. She also began to lose weight and lost 23 pounds. She had the Implanon removed 1 month ago and she is feeling a lot better. She has gained 5 pounds back. She had single episode of hematemesis. She is not sure if it was vomiting or she had swallowed blood from epistaxis. She denies melena or rectal bleeding.  Past Medical History  Diagnosis Date  . Heart palpitations 2015    sinus tach-takes propanolol  . Migraines   . Panic attacks   . Hypertension   . Complication of anesthesia     Felt like she could not breathe after anesthesia when wisdom teethe removed    Past Surgical History  Procedure Laterality Date  . Mouth surgery    . Hand surgery    . Wisdom tooth extraction      Family History  Problem Relation Age of Onset  . Heart disease Father   . Colitis Brother   . Thyroid disease Sister   . Colitis Brother   . Colon cancer Paternal Grandmother    Social History:  reports that she has quit smoking. Her smoking use included Cigarettes. She smoked 0.25 packs per day. She has never used smokeless tobacco. She reports that she does not drink alcohol or use illicit drugs.  Allergies:  Allergies  Allergen Reactions  . Zanaflex [Tizanidine Hcl] Other (See Comments)    Panic attack    Medications Prior to Admission  Medication Sig Dispense Refill  . bismuth subsalicylate (PEPTO BISMOL) 262 MG chewable tablet Chew 524 mg by mouth as needed.    . diazepam (VALIUM) 5 MG tablet Take 5 mg by mouth every 6 (six) hours as needed for anxiety.    Marland Kitchen esomeprazole (NEXIUM) 40 MG capsule Take 40 mg by mouth daily at 12 noon.    . Prenatal Multivit-Min-Fe-FA (PRENATAL VITAMINS PO) Take by  mouth.    . Cetirizine HCl 10 MG CAPS Take by mouth.      No results found for this or any previous visit (from the past 48 hour(s)). No results found.  ROS  Blood pressure 112/83, pulse 76, temperature 97.5 F (36.4 C), temperature source Oral, resp. rate 18, height 5\' 6"  (1.676 m), weight 110 lb (49.896 kg), last menstrual period 04/20/2015, SpO2 99 %. Physical Exam  Constitutional:  Thin Caucasian female in NAD.  HENT:  Mouth/Throat: Oropharynx is clear and moist.  Eyes: Conjunctivae are normal. No scleral icterus.  Neck: No thyromegaly present.  Cardiovascular: Normal rate, regular rhythm and normal heart sounds.   No murmur heard. Respiratory: Effort normal and breath sounds normal.  GI:  Flat abdomen. It is soft with mild tenderness below the right costal margin. No organomegaly or masses.  Musculoskeletal: She exhibits no edema.  Lymphadenopathy:    She has no cervical adenopathy.  Neurological: She is alert.  Skin: Skin is warm and dry.     Assessment/Plan History of nausea vomiting and chronic heartburn. Weight loss. Diagnostic EGD.  Shanzay Hepworth U 04/26/2015, 2:25 PM

## 2015-04-26 NOTE — Op Note (Signed)
EGD PROCEDURE REPORT  PATIENT:  Carrie Giles  MR#:  [id number] Birthdate:  Mar 11, 1986, 29 y.o., female Endoscopist:  Dr. Malissa Hippo, MD Referred By:  Dr. Colette Ribas, MD Procedure Date: 04/26/2015  Procedure:   EGD  Indications:   Patient is 29 year old Caucasian female who presents with history of nausea vomiting frequent heartburn single episode of hematemesis and weight loss. Patient states she's feeling much better since she had implant removed 4 weeks ago. Patient has had symptoms for years but they were infrequent until August 2015 when she had Nexplanon placed.           Informed Consent:  The risks, benefits, alternatives & imponderables which include, but are not limited to, bleeding, infection, perforation, drug reaction and potential missed lesion have been reviewed.  The potential for biopsy, lesion removal, esophageal dilation, etc. have also been discussed.  Questions have been answered.  All parties agreeable.  Please see history & physical in medical record for more information.  Medications:  Demerol 75 mg IV Versed 13 mg IV Cetacaine spray topically for oropharyngeal anesthesia  Description of procedure:  The endoscope was introduced through the mouth and advanced to the second portion of the duodenum without difficulty or limitations. The mucosal surfaces were surveyed very carefully during advancement of the scope and upon withdrawal.  Findings:  Esophagus:  Mucosa of the esophagus was normal. Focal edema and erythema noted at GE junction. GEJ:  40 cm Hiatus:  42 cm Stomach:  Stomach was empty and distended very well with insufflation. Folds in the proximal stomach were normal. Examination mucosa and gastric body, antrum, pyloric channel, angularis fundus and cardia was normal. Duodenum:  Normal bulbar and post bulbar mucosa.  Therapeutic/Diagnostic Maneuvers Performed:  None  Complications:  None  Impression: Mild changes of reflux esophagitis  limited to GE junction. Small sliding hiatal hernia. No evidence of peptic ulcer disease or gastritis.  Recommendations:  Standard instructions given. Patient will continue anti-reflex measures and Nexium as before. Office visit in one month unless symptoms relapse in which case we'll proceed with upper abdominal ultrasound and GI study to rule out SMA syndrome.  REHMAN,NAJEEB U  04/26/2015  3:06 PM  CC: Dr. Phillips Odor, Chancy Hurter, MD & Dr. Bonnetta Barry ref. provider found

## 2015-04-26 NOTE — Discharge Instructions (Signed)
Resume usual medications and diet. No driving for 24 hours. Please call office if his symptoms relapse. Office visit in one month.      Esophagogastroduodenoscopy Care After Refer to this sheet in the next few weeks. These instructions provide you with information on caring for yourself after your procedure. Your caregiver may also give you more specific instructions. Your treatment has been planned according to current medical practices, but problems sometimes occur. Call your caregiver if you have any problems or questions after your procedure.  HOME CARE INSTRUCTIONS  Do not eat or drink anything until the numbing medicine (local anesthetic) has worn off and your gag reflex has returned. You will know that the local anesthetic has worn off when you can swallow comfortably.  Do not drive for 12 hours after the procedure or as directed by your caregiver.  Only take medicines as directed by your caregiver. SEEK MEDICAL CARE IF:   You cannot stop coughing.  You are not urinating at all or less than usual. SEEK IMMEDIATE MEDICAL CARE IF:  You have difficulty swallowing.  You cannot eat or drink.  You have worsening throat or chest pain.  You have dizziness, lightheadedness, or you faint.  You have nausea or vomiting.  You have chills.  You have a fever.  You have severe abdominal pain.  You have black, tarry, or bloody stools. Document Released: 10/12/2012 Document Reviewed: 10/12/2012 [facility] Patient Information 2015 [place], [place]. This information is not intended to replace advice given to you by your health care provider. Make sure you discuss any questions you have with your health care provider.

## 2015-04-29 ENCOUNTER — Encounter (HOSPITAL_COMMUNITY): Payer: Self-pay | Admitting: Internal Medicine

## 2015-04-30 ENCOUNTER — Telehealth (INDEPENDENT_AMBULATORY_CARE_PROVIDER_SITE_OTHER): Payer: Self-pay | Admitting: *Deleted

## 2015-04-30 NOTE — Telephone Encounter (Signed)
Per op note, patient needs OV 1 month

## 2015-05-02 ENCOUNTER — Encounter (INDEPENDENT_AMBULATORY_CARE_PROVIDER_SITE_OTHER): Payer: Self-pay | Admitting: *Deleted

## 2015-05-02 NOTE — Telephone Encounter (Signed)
Apt has been scheduled for 05/30/15 at 930 am with Dorene Ar, NP.

## 2015-05-02 NOTE — Telephone Encounter (Signed)
LM for patient to return the call on 04/29/15, 05/01/15 then 05/02/15 was not able to LM

## 2015-05-30 ENCOUNTER — Ambulatory Visit (INDEPENDENT_AMBULATORY_CARE_PROVIDER_SITE_OTHER): Payer: 59 | Admitting: Internal Medicine

## 2015-07-10 ENCOUNTER — Encounter (INDEPENDENT_AMBULATORY_CARE_PROVIDER_SITE_OTHER): Payer: Self-pay | Admitting: *Deleted

## 2016-06-30 DIAGNOSIS — K2970 Gastritis, unspecified, without bleeding: Secondary | ICD-10-CM | POA: Diagnosis not present

## 2016-06-30 DIAGNOSIS — Z1389 Encounter for screening for other disorder: Secondary | ICD-10-CM | POA: Diagnosis not present

## 2016-06-30 DIAGNOSIS — Z681 Body mass index (BMI) 19 or less, adult: Secondary | ICD-10-CM | POA: Diagnosis not present

## 2016-08-03 DIAGNOSIS — M9901 Segmental and somatic dysfunction of cervical region: Secondary | ICD-10-CM | POA: Diagnosis not present

## 2016-08-03 DIAGNOSIS — M41125 Adolescent idiopathic scoliosis, thoracolumbar region: Secondary | ICD-10-CM | POA: Diagnosis not present

## 2016-08-03 DIAGNOSIS — M9903 Segmental and somatic dysfunction of lumbar region: Secondary | ICD-10-CM | POA: Diagnosis not present

## 2016-08-03 DIAGNOSIS — G43009 Migraine without aura, not intractable, without status migrainosus: Secondary | ICD-10-CM | POA: Diagnosis not present

## 2016-08-31 DIAGNOSIS — G43009 Migraine without aura, not intractable, without status migrainosus: Secondary | ICD-10-CM | POA: Diagnosis not present

## 2016-08-31 DIAGNOSIS — M9901 Segmental and somatic dysfunction of cervical region: Secondary | ICD-10-CM | POA: Diagnosis not present

## 2016-08-31 DIAGNOSIS — M41125 Adolescent idiopathic scoliosis, thoracolumbar region: Secondary | ICD-10-CM | POA: Diagnosis not present

## 2016-08-31 DIAGNOSIS — M9903 Segmental and somatic dysfunction of lumbar region: Secondary | ICD-10-CM | POA: Diagnosis not present

## 2016-09-21 DIAGNOSIS — M41125 Adolescent idiopathic scoliosis, thoracolumbar region: Secondary | ICD-10-CM | POA: Diagnosis not present

## 2016-09-21 DIAGNOSIS — M9901 Segmental and somatic dysfunction of cervical region: Secondary | ICD-10-CM | POA: Diagnosis not present

## 2016-09-21 DIAGNOSIS — M9903 Segmental and somatic dysfunction of lumbar region: Secondary | ICD-10-CM | POA: Diagnosis not present

## 2016-09-21 DIAGNOSIS — G43009 Migraine without aura, not intractable, without status migrainosus: Secondary | ICD-10-CM | POA: Diagnosis not present

## 2016-10-05 DIAGNOSIS — M9901 Segmental and somatic dysfunction of cervical region: Secondary | ICD-10-CM | POA: Diagnosis not present

## 2016-10-05 DIAGNOSIS — M41125 Adolescent idiopathic scoliosis, thoracolumbar region: Secondary | ICD-10-CM | POA: Diagnosis not present

## 2016-10-05 DIAGNOSIS — M9903 Segmental and somatic dysfunction of lumbar region: Secondary | ICD-10-CM | POA: Diagnosis not present

## 2016-10-05 DIAGNOSIS — G43009 Migraine without aura, not intractable, without status migrainosus: Secondary | ICD-10-CM | POA: Diagnosis not present

## 2016-12-29 DIAGNOSIS — L049 Acute lymphadenitis, unspecified: Secondary | ICD-10-CM | POA: Diagnosis not present

## 2016-12-29 DIAGNOSIS — Z1389 Encounter for screening for other disorder: Secondary | ICD-10-CM | POA: Diagnosis not present

## 2016-12-29 DIAGNOSIS — Z681 Body mass index (BMI) 19 or less, adult: Secondary | ICD-10-CM | POA: Diagnosis not present

## 2016-12-29 DIAGNOSIS — J029 Acute pharyngitis, unspecified: Secondary | ICD-10-CM | POA: Diagnosis not present

## 2017-02-18 DIAGNOSIS — F4320 Adjustment disorder, unspecified: Secondary | ICD-10-CM | POA: Diagnosis not present

## 2017-02-18 DIAGNOSIS — Z681 Body mass index (BMI) 19 or less, adult: Secondary | ICD-10-CM | POA: Diagnosis not present

## 2017-02-25 DIAGNOSIS — M9902 Segmental and somatic dysfunction of thoracic region: Secondary | ICD-10-CM | POA: Diagnosis not present

## 2017-02-25 DIAGNOSIS — G43009 Migraine without aura, not intractable, without status migrainosus: Secondary | ICD-10-CM | POA: Diagnosis not present

## 2017-02-25 DIAGNOSIS — M9903 Segmental and somatic dysfunction of lumbar region: Secondary | ICD-10-CM | POA: Diagnosis not present

## 2017-02-25 DIAGNOSIS — M41125 Adolescent idiopathic scoliosis, thoracolumbar region: Secondary | ICD-10-CM | POA: Diagnosis not present

## 2017-04-19 DIAGNOSIS — G43009 Migraine without aura, not intractable, without status migrainosus: Secondary | ICD-10-CM | POA: Diagnosis not present

## 2017-04-19 DIAGNOSIS — M41125 Adolescent idiopathic scoliosis, thoracolumbar region: Secondary | ICD-10-CM | POA: Diagnosis not present

## 2017-04-19 DIAGNOSIS — M9902 Segmental and somatic dysfunction of thoracic region: Secondary | ICD-10-CM | POA: Diagnosis not present

## 2017-04-19 DIAGNOSIS — M9903 Segmental and somatic dysfunction of lumbar region: Secondary | ICD-10-CM | POA: Diagnosis not present

## 2017-05-24 DIAGNOSIS — M9903 Segmental and somatic dysfunction of lumbar region: Secondary | ICD-10-CM | POA: Diagnosis not present

## 2017-05-24 DIAGNOSIS — M41125 Adolescent idiopathic scoliosis, thoracolumbar region: Secondary | ICD-10-CM | POA: Diagnosis not present

## 2017-05-24 DIAGNOSIS — G43009 Migraine without aura, not intractable, without status migrainosus: Secondary | ICD-10-CM | POA: Diagnosis not present

## 2017-05-24 DIAGNOSIS — M9902 Segmental and somatic dysfunction of thoracic region: Secondary | ICD-10-CM | POA: Diagnosis not present

## 2017-06-28 DIAGNOSIS — M41123 Adolescent idiopathic scoliosis, cervicothoracic region: Secondary | ICD-10-CM | POA: Diagnosis not present

## 2017-06-28 DIAGNOSIS — M9902 Segmental and somatic dysfunction of thoracic region: Secondary | ICD-10-CM | POA: Diagnosis not present

## 2017-06-28 DIAGNOSIS — M9903 Segmental and somatic dysfunction of lumbar region: Secondary | ICD-10-CM | POA: Diagnosis not present

## 2017-06-28 DIAGNOSIS — M41125 Adolescent idiopathic scoliosis, thoracolumbar region: Secondary | ICD-10-CM | POA: Diagnosis not present

## 2017-07-15 DIAGNOSIS — Z6820 Body mass index (BMI) 20.0-20.9, adult: Secondary | ICD-10-CM | POA: Diagnosis not present

## 2017-07-15 DIAGNOSIS — Z1389 Encounter for screening for other disorder: Secondary | ICD-10-CM | POA: Diagnosis not present

## 2017-07-15 DIAGNOSIS — T1591XA Foreign body on external eye, part unspecified, right eye, initial encounter: Secondary | ICD-10-CM | POA: Diagnosis not present

## 2017-07-16 DIAGNOSIS — T1501XA Foreign body in cornea, right eye, initial encounter: Secondary | ICD-10-CM | POA: Diagnosis not present

## 2017-08-30 DIAGNOSIS — M9903 Segmental and somatic dysfunction of lumbar region: Secondary | ICD-10-CM | POA: Diagnosis not present

## 2017-08-30 DIAGNOSIS — M9902 Segmental and somatic dysfunction of thoracic region: Secondary | ICD-10-CM | POA: Diagnosis not present

## 2017-08-30 DIAGNOSIS — M41125 Adolescent idiopathic scoliosis, thoracolumbar region: Secondary | ICD-10-CM | POA: Diagnosis not present

## 2017-08-30 DIAGNOSIS — M41123 Adolescent idiopathic scoliosis, cervicothoracic region: Secondary | ICD-10-CM | POA: Diagnosis not present

## 2017-09-16 DIAGNOSIS — M41125 Adolescent idiopathic scoliosis, thoracolumbar region: Secondary | ICD-10-CM | POA: Diagnosis not present

## 2017-09-16 DIAGNOSIS — M9903 Segmental and somatic dysfunction of lumbar region: Secondary | ICD-10-CM | POA: Diagnosis not present

## 2017-09-16 DIAGNOSIS — M9902 Segmental and somatic dysfunction of thoracic region: Secondary | ICD-10-CM | POA: Diagnosis not present

## 2017-09-16 DIAGNOSIS — M41123 Adolescent idiopathic scoliosis, cervicothoracic region: Secondary | ICD-10-CM | POA: Diagnosis not present

## 2017-11-04 DIAGNOSIS — M41123 Adolescent idiopathic scoliosis, cervicothoracic region: Secondary | ICD-10-CM | POA: Diagnosis not present

## 2017-11-04 DIAGNOSIS — M9901 Segmental and somatic dysfunction of cervical region: Secondary | ICD-10-CM | POA: Diagnosis not present

## 2017-11-04 DIAGNOSIS — M41125 Adolescent idiopathic scoliosis, thoracolumbar region: Secondary | ICD-10-CM | POA: Diagnosis not present

## 2017-11-04 DIAGNOSIS — M9902 Segmental and somatic dysfunction of thoracic region: Secondary | ICD-10-CM | POA: Diagnosis not present

## 2017-11-11 DIAGNOSIS — M9901 Segmental and somatic dysfunction of cervical region: Secondary | ICD-10-CM | POA: Diagnosis not present

## 2017-11-11 DIAGNOSIS — M41125 Adolescent idiopathic scoliosis, thoracolumbar region: Secondary | ICD-10-CM | POA: Diagnosis not present

## 2017-11-11 DIAGNOSIS — M9902 Segmental and somatic dysfunction of thoracic region: Secondary | ICD-10-CM | POA: Diagnosis not present

## 2017-11-11 DIAGNOSIS — M41123 Adolescent idiopathic scoliosis, cervicothoracic region: Secondary | ICD-10-CM | POA: Diagnosis not present

## 2017-11-18 DIAGNOSIS — M41123 Adolescent idiopathic scoliosis, cervicothoracic region: Secondary | ICD-10-CM | POA: Diagnosis not present

## 2017-11-18 DIAGNOSIS — M9901 Segmental and somatic dysfunction of cervical region: Secondary | ICD-10-CM | POA: Diagnosis not present

## 2017-11-18 DIAGNOSIS — M41125 Adolescent idiopathic scoliosis, thoracolumbar region: Secondary | ICD-10-CM | POA: Diagnosis not present

## 2017-11-18 DIAGNOSIS — M9902 Segmental and somatic dysfunction of thoracic region: Secondary | ICD-10-CM | POA: Diagnosis not present

## 2017-11-25 DIAGNOSIS — M9901 Segmental and somatic dysfunction of cervical region: Secondary | ICD-10-CM | POA: Diagnosis not present

## 2017-11-25 DIAGNOSIS — M41123 Adolescent idiopathic scoliosis, cervicothoracic region: Secondary | ICD-10-CM | POA: Diagnosis not present

## 2017-11-25 DIAGNOSIS — M41125 Adolescent idiopathic scoliosis, thoracolumbar region: Secondary | ICD-10-CM | POA: Diagnosis not present

## 2017-11-25 DIAGNOSIS — M9902 Segmental and somatic dysfunction of thoracic region: Secondary | ICD-10-CM | POA: Diagnosis not present

## 2017-12-02 DIAGNOSIS — M9901 Segmental and somatic dysfunction of cervical region: Secondary | ICD-10-CM | POA: Diagnosis not present

## 2017-12-02 DIAGNOSIS — M41125 Adolescent idiopathic scoliosis, thoracolumbar region: Secondary | ICD-10-CM | POA: Diagnosis not present

## 2017-12-02 DIAGNOSIS — M41123 Adolescent idiopathic scoliosis, cervicothoracic region: Secondary | ICD-10-CM | POA: Diagnosis not present

## 2017-12-09 DIAGNOSIS — M9901 Segmental and somatic dysfunction of cervical region: Secondary | ICD-10-CM | POA: Diagnosis not present

## 2017-12-09 DIAGNOSIS — M41125 Adolescent idiopathic scoliosis, thoracolumbar region: Secondary | ICD-10-CM | POA: Diagnosis not present

## 2017-12-09 DIAGNOSIS — M9902 Segmental and somatic dysfunction of thoracic region: Secondary | ICD-10-CM | POA: Diagnosis not present

## 2017-12-09 DIAGNOSIS — M41123 Adolescent idiopathic scoliosis, cervicothoracic region: Secondary | ICD-10-CM | POA: Diagnosis not present

## 2017-12-16 DIAGNOSIS — M41123 Adolescent idiopathic scoliosis, cervicothoracic region: Secondary | ICD-10-CM | POA: Diagnosis not present

## 2017-12-16 DIAGNOSIS — M41125 Adolescent idiopathic scoliosis, thoracolumbar region: Secondary | ICD-10-CM | POA: Diagnosis not present

## 2017-12-16 DIAGNOSIS — M9901 Segmental and somatic dysfunction of cervical region: Secondary | ICD-10-CM | POA: Diagnosis not present

## 2017-12-16 DIAGNOSIS — M9902 Segmental and somatic dysfunction of thoracic region: Secondary | ICD-10-CM | POA: Diagnosis not present

## 2017-12-23 DIAGNOSIS — M41123 Adolescent idiopathic scoliosis, cervicothoracic region: Secondary | ICD-10-CM | POA: Diagnosis not present

## 2017-12-23 DIAGNOSIS — M9902 Segmental and somatic dysfunction of thoracic region: Secondary | ICD-10-CM | POA: Diagnosis not present

## 2017-12-23 DIAGNOSIS — M9901 Segmental and somatic dysfunction of cervical region: Secondary | ICD-10-CM | POA: Diagnosis not present

## 2017-12-23 DIAGNOSIS — M41125 Adolescent idiopathic scoliosis, thoracolumbar region: Secondary | ICD-10-CM | POA: Diagnosis not present

## 2017-12-30 DIAGNOSIS — Z1389 Encounter for screening for other disorder: Secondary | ICD-10-CM | POA: Diagnosis not present

## 2017-12-30 DIAGNOSIS — M9901 Segmental and somatic dysfunction of cervical region: Secondary | ICD-10-CM | POA: Diagnosis not present

## 2017-12-30 DIAGNOSIS — Z6821 Body mass index (BMI) 21.0-21.9, adult: Secondary | ICD-10-CM | POA: Diagnosis not present

## 2017-12-30 DIAGNOSIS — F419 Anxiety disorder, unspecified: Secondary | ICD-10-CM | POA: Diagnosis not present

## 2017-12-30 DIAGNOSIS — L210 Seborrhea capitis: Secondary | ICD-10-CM | POA: Diagnosis not present

## 2017-12-30 DIAGNOSIS — L308 Other specified dermatitis: Secondary | ICD-10-CM | POA: Diagnosis not present

## 2017-12-30 DIAGNOSIS — M41125 Adolescent idiopathic scoliosis, thoracolumbar region: Secondary | ICD-10-CM | POA: Diagnosis not present

## 2017-12-30 DIAGNOSIS — M41123 Adolescent idiopathic scoliosis, cervicothoracic region: Secondary | ICD-10-CM | POA: Diagnosis not present

## 2017-12-30 DIAGNOSIS — M9902 Segmental and somatic dysfunction of thoracic region: Secondary | ICD-10-CM | POA: Diagnosis not present

## 2018-02-10 DIAGNOSIS — M9902 Segmental and somatic dysfunction of thoracic region: Secondary | ICD-10-CM | POA: Diagnosis not present

## 2018-02-10 DIAGNOSIS — M9901 Segmental and somatic dysfunction of cervical region: Secondary | ICD-10-CM | POA: Diagnosis not present

## 2018-02-10 DIAGNOSIS — M41123 Adolescent idiopathic scoliosis, cervicothoracic region: Secondary | ICD-10-CM | POA: Diagnosis not present

## 2018-02-10 DIAGNOSIS — M41125 Adolescent idiopathic scoliosis, thoracolumbar region: Secondary | ICD-10-CM | POA: Diagnosis not present

## 2018-03-03 DIAGNOSIS — M9901 Segmental and somatic dysfunction of cervical region: Secondary | ICD-10-CM | POA: Diagnosis not present

## 2018-03-03 DIAGNOSIS — M41125 Adolescent idiopathic scoliosis, thoracolumbar region: Secondary | ICD-10-CM | POA: Diagnosis not present

## 2018-03-03 DIAGNOSIS — M9902 Segmental and somatic dysfunction of thoracic region: Secondary | ICD-10-CM | POA: Diagnosis not present

## 2018-03-03 DIAGNOSIS — M41123 Adolescent idiopathic scoliosis, cervicothoracic region: Secondary | ICD-10-CM | POA: Diagnosis not present

## 2018-03-31 DIAGNOSIS — Z6821 Body mass index (BMI) 21.0-21.9, adult: Secondary | ICD-10-CM | POA: Diagnosis not present

## 2018-03-31 DIAGNOSIS — J042 Acute laryngotracheitis: Secondary | ICD-10-CM | POA: Diagnosis not present

## 2018-03-31 DIAGNOSIS — Z1389 Encounter for screening for other disorder: Secondary | ICD-10-CM | POA: Diagnosis not present

## 2018-03-31 DIAGNOSIS — J329 Chronic sinusitis, unspecified: Secondary | ICD-10-CM | POA: Diagnosis not present

## 2018-04-21 DIAGNOSIS — M41123 Adolescent idiopathic scoliosis, cervicothoracic region: Secondary | ICD-10-CM | POA: Diagnosis not present

## 2018-04-21 DIAGNOSIS — M9901 Segmental and somatic dysfunction of cervical region: Secondary | ICD-10-CM | POA: Diagnosis not present

## 2018-04-21 DIAGNOSIS — M9902 Segmental and somatic dysfunction of thoracic region: Secondary | ICD-10-CM | POA: Diagnosis not present

## 2018-04-21 DIAGNOSIS — M41125 Adolescent idiopathic scoliosis, thoracolumbar region: Secondary | ICD-10-CM | POA: Diagnosis not present

## 2018-05-02 DIAGNOSIS — J019 Acute sinusitis, unspecified: Secondary | ICD-10-CM | POA: Diagnosis not present

## 2018-05-02 DIAGNOSIS — Z6821 Body mass index (BMI) 21.0-21.9, adult: Secondary | ICD-10-CM | POA: Diagnosis not present

## 2018-05-02 DIAGNOSIS — R05 Cough: Secondary | ICD-10-CM | POA: Diagnosis not present

## 2018-05-02 DIAGNOSIS — R51 Headache: Secondary | ICD-10-CM | POA: Diagnosis not present

## 2018-05-02 DIAGNOSIS — Z1389 Encounter for screening for other disorder: Secondary | ICD-10-CM | POA: Diagnosis not present

## 2018-05-05 DIAGNOSIS — M41123 Adolescent idiopathic scoliosis, cervicothoracic region: Secondary | ICD-10-CM | POA: Diagnosis not present

## 2018-05-05 DIAGNOSIS — M9901 Segmental and somatic dysfunction of cervical region: Secondary | ICD-10-CM | POA: Diagnosis not present

## 2018-05-05 DIAGNOSIS — M9902 Segmental and somatic dysfunction of thoracic region: Secondary | ICD-10-CM | POA: Diagnosis not present

## 2018-05-05 DIAGNOSIS — M41125 Adolescent idiopathic scoliosis, thoracolumbar region: Secondary | ICD-10-CM | POA: Diagnosis not present

## 2018-05-26 DIAGNOSIS — M9901 Segmental and somatic dysfunction of cervical region: Secondary | ICD-10-CM | POA: Diagnosis not present

## 2018-05-26 DIAGNOSIS — G43009 Migraine without aura, not intractable, without status migrainosus: Secondary | ICD-10-CM | POA: Diagnosis not present

## 2018-06-09 DIAGNOSIS — M41123 Adolescent idiopathic scoliosis, cervicothoracic region: Secondary | ICD-10-CM | POA: Diagnosis not present

## 2018-06-09 DIAGNOSIS — M9902 Segmental and somatic dysfunction of thoracic region: Secondary | ICD-10-CM | POA: Diagnosis not present

## 2018-06-09 DIAGNOSIS — M9901 Segmental and somatic dysfunction of cervical region: Secondary | ICD-10-CM | POA: Diagnosis not present

## 2018-07-07 DIAGNOSIS — G43009 Migraine without aura, not intractable, without status migrainosus: Secondary | ICD-10-CM | POA: Diagnosis not present

## 2018-07-07 DIAGNOSIS — M41123 Adolescent idiopathic scoliosis, cervicothoracic region: Secondary | ICD-10-CM | POA: Diagnosis not present

## 2018-07-07 DIAGNOSIS — M9902 Segmental and somatic dysfunction of thoracic region: Secondary | ICD-10-CM | POA: Diagnosis not present

## 2018-07-07 DIAGNOSIS — M9901 Segmental and somatic dysfunction of cervical region: Secondary | ICD-10-CM | POA: Diagnosis not present

## 2018-08-11 DIAGNOSIS — M9901 Segmental and somatic dysfunction of cervical region: Secondary | ICD-10-CM | POA: Diagnosis not present

## 2018-08-11 DIAGNOSIS — M9902 Segmental and somatic dysfunction of thoracic region: Secondary | ICD-10-CM | POA: Diagnosis not present

## 2018-08-11 DIAGNOSIS — G43009 Migraine without aura, not intractable, without status migrainosus: Secondary | ICD-10-CM | POA: Diagnosis not present

## 2018-08-11 DIAGNOSIS — M41123 Adolescent idiopathic scoliosis, cervicothoracic region: Secondary | ICD-10-CM | POA: Diagnosis not present

## 2018-08-25 DIAGNOSIS — G43009 Migraine without aura, not intractable, without status migrainosus: Secondary | ICD-10-CM | POA: Diagnosis not present

## 2018-08-25 DIAGNOSIS — M9901 Segmental and somatic dysfunction of cervical region: Secondary | ICD-10-CM | POA: Diagnosis not present

## 2018-08-25 DIAGNOSIS — M41123 Adolescent idiopathic scoliosis, cervicothoracic region: Secondary | ICD-10-CM | POA: Diagnosis not present

## 2018-08-25 DIAGNOSIS — M9902 Segmental and somatic dysfunction of thoracic region: Secondary | ICD-10-CM | POA: Diagnosis not present

## 2018-09-22 DIAGNOSIS — M9901 Segmental and somatic dysfunction of cervical region: Secondary | ICD-10-CM | POA: Diagnosis not present

## 2018-09-22 DIAGNOSIS — M41123 Adolescent idiopathic scoliosis, cervicothoracic region: Secondary | ICD-10-CM | POA: Diagnosis not present

## 2018-09-22 DIAGNOSIS — G43009 Migraine without aura, not intractable, without status migrainosus: Secondary | ICD-10-CM | POA: Diagnosis not present

## 2018-09-22 DIAGNOSIS — M9902 Segmental and somatic dysfunction of thoracic region: Secondary | ICD-10-CM | POA: Diagnosis not present

## 2018-10-25 DIAGNOSIS — J069 Acute upper respiratory infection, unspecified: Secondary | ICD-10-CM | POA: Diagnosis not present

## 2018-10-25 DIAGNOSIS — Z1389 Encounter for screening for other disorder: Secondary | ICD-10-CM | POA: Diagnosis not present

## 2018-10-25 DIAGNOSIS — Z6822 Body mass index (BMI) 22.0-22.9, adult: Secondary | ICD-10-CM | POA: Diagnosis not present

## 2018-11-14 DIAGNOSIS — G43009 Migraine without aura, not intractable, without status migrainosus: Secondary | ICD-10-CM | POA: Diagnosis not present

## 2018-11-14 DIAGNOSIS — M9901 Segmental and somatic dysfunction of cervical region: Secondary | ICD-10-CM | POA: Diagnosis not present

## 2018-11-14 DIAGNOSIS — M9902 Segmental and somatic dysfunction of thoracic region: Secondary | ICD-10-CM | POA: Diagnosis not present

## 2018-11-14 DIAGNOSIS — M41123 Adolescent idiopathic scoliosis, cervicothoracic region: Secondary | ICD-10-CM | POA: Diagnosis not present

## 2019-01-05 DIAGNOSIS — G43009 Migraine without aura, not intractable, without status migrainosus: Secondary | ICD-10-CM | POA: Diagnosis not present

## 2019-01-05 DIAGNOSIS — M9901 Segmental and somatic dysfunction of cervical region: Secondary | ICD-10-CM | POA: Diagnosis not present

## 2019-01-05 DIAGNOSIS — M9902 Segmental and somatic dysfunction of thoracic region: Secondary | ICD-10-CM | POA: Diagnosis not present

## 2019-01-05 DIAGNOSIS — M41123 Adolescent idiopathic scoliosis, cervicothoracic region: Secondary | ICD-10-CM | POA: Diagnosis not present

## 2019-05-22 DIAGNOSIS — Z1389 Encounter for screening for other disorder: Secondary | ICD-10-CM | POA: Diagnosis not present

## 2019-05-22 DIAGNOSIS — K219 Gastro-esophageal reflux disease without esophagitis: Secondary | ICD-10-CM | POA: Diagnosis not present

## 2019-05-22 DIAGNOSIS — F419 Anxiety disorder, unspecified: Secondary | ICD-10-CM | POA: Diagnosis not present

## 2019-05-22 DIAGNOSIS — Z6821 Body mass index (BMI) 21.0-21.9, adult: Secondary | ICD-10-CM | POA: Diagnosis not present

## 2019-07-27 DIAGNOSIS — F419 Anxiety disorder, unspecified: Secondary | ICD-10-CM | POA: Diagnosis not present

## 2019-10-02 DIAGNOSIS — M9902 Segmental and somatic dysfunction of thoracic region: Secondary | ICD-10-CM | POA: Diagnosis not present

## 2019-10-02 DIAGNOSIS — M9901 Segmental and somatic dysfunction of cervical region: Secondary | ICD-10-CM | POA: Diagnosis not present

## 2019-10-02 DIAGNOSIS — G43009 Migraine without aura, not intractable, without status migrainosus: Secondary | ICD-10-CM | POA: Diagnosis not present

## 2019-10-02 DIAGNOSIS — M41123 Adolescent idiopathic scoliosis, cervicothoracic region: Secondary | ICD-10-CM | POA: Diagnosis not present

## 2019-10-04 DIAGNOSIS — M9902 Segmental and somatic dysfunction of thoracic region: Secondary | ICD-10-CM | POA: Diagnosis not present

## 2019-10-04 DIAGNOSIS — M41123 Adolescent idiopathic scoliosis, cervicothoracic region: Secondary | ICD-10-CM | POA: Diagnosis not present

## 2019-10-04 DIAGNOSIS — G43009 Migraine without aura, not intractable, without status migrainosus: Secondary | ICD-10-CM | POA: Diagnosis not present

## 2019-10-04 DIAGNOSIS — M9901 Segmental and somatic dysfunction of cervical region: Secondary | ICD-10-CM | POA: Diagnosis not present

## 2019-10-18 DIAGNOSIS — M9902 Segmental and somatic dysfunction of thoracic region: Secondary | ICD-10-CM | POA: Diagnosis not present

## 2019-10-18 DIAGNOSIS — M542 Cervicalgia: Secondary | ICD-10-CM | POA: Diagnosis not present

## 2019-10-18 DIAGNOSIS — M41123 Adolescent idiopathic scoliosis, cervicothoracic region: Secondary | ICD-10-CM | POA: Diagnosis not present

## 2019-10-18 DIAGNOSIS — M9901 Segmental and somatic dysfunction of cervical region: Secondary | ICD-10-CM | POA: Diagnosis not present

## 2019-10-23 DIAGNOSIS — M9902 Segmental and somatic dysfunction of thoracic region: Secondary | ICD-10-CM | POA: Diagnosis not present

## 2019-10-23 DIAGNOSIS — M41123 Adolescent idiopathic scoliosis, cervicothoracic region: Secondary | ICD-10-CM | POA: Diagnosis not present

## 2019-10-23 DIAGNOSIS — M542 Cervicalgia: Secondary | ICD-10-CM | POA: Diagnosis not present

## 2019-10-23 DIAGNOSIS — M9901 Segmental and somatic dysfunction of cervical region: Secondary | ICD-10-CM | POA: Diagnosis not present

## 2019-10-30 DIAGNOSIS — M9901 Segmental and somatic dysfunction of cervical region: Secondary | ICD-10-CM | POA: Diagnosis not present

## 2019-10-30 DIAGNOSIS — M41123 Adolescent idiopathic scoliosis, cervicothoracic region: Secondary | ICD-10-CM | POA: Diagnosis not present

## 2019-10-30 DIAGNOSIS — M9902 Segmental and somatic dysfunction of thoracic region: Secondary | ICD-10-CM | POA: Diagnosis not present

## 2019-10-30 DIAGNOSIS — M542 Cervicalgia: Secondary | ICD-10-CM | POA: Diagnosis not present
